# Patient Record
Sex: Female | Born: 1935 | Race: Black or African American | Hispanic: No | Marital: Married | Smoking: Never smoker
Health system: Southern US, Community
[De-identification: ages and names within clinical notes are randomized; demographics above are authoritative.]

## PROBLEM LIST (undated history)

## (undated) DIAGNOSIS — I10 Essential (primary) hypertension: Secondary | ICD-10-CM

## (undated) DIAGNOSIS — IMO0001 Reserved for inherently not codable concepts without codable children: Secondary | ICD-10-CM

---

## 2005-03-21 ENCOUNTER — Encounter: Admission: RE | Admit: 2005-03-21 | Discharge: 2005-03-21 | Payer: Self-pay | Admitting: *Deleted

## 2006-03-23 ENCOUNTER — Encounter: Admission: RE | Admit: 2006-03-23 | Discharge: 2006-03-23 | Payer: Self-pay | Admitting: *Deleted

## 2006-07-22 ENCOUNTER — Emergency Department (HOSPITAL_COMMUNITY): Admission: EM | Admit: 2006-07-22 | Discharge: 2006-07-22 | Payer: Self-pay | Admitting: Emergency Medicine

## 2010-01-15 ENCOUNTER — Emergency Department (HOSPITAL_COMMUNITY): Admission: EM | Admit: 2010-01-15 | Discharge: 2010-01-15 | Payer: Self-pay | Admitting: Emergency Medicine

## 2015-02-24 ENCOUNTER — Inpatient Hospital Stay (HOSPITAL_COMMUNITY)
Admission: EM | Admit: 2015-02-24 | Discharge: 2015-03-02 | DRG: 469 | Disposition: A | Discharge status: Skilled Nursing Facility | Payer: Medicare Other | Attending: Internal Medicine | Admitting: Internal Medicine

## 2015-02-24 ENCOUNTER — Encounter (HOSPITAL_COMMUNITY): Payer: Self-pay | Admitting: *Deleted

## 2015-02-24 ENCOUNTER — Inpatient Hospital Stay (HOSPITAL_COMMUNITY): Payer: Medicare Other

## 2015-02-24 ENCOUNTER — Emergency Department (HOSPITAL_COMMUNITY): Payer: Medicare Other

## 2015-02-24 DIAGNOSIS — D649 Anemia, unspecified: Secondary | ICD-10-CM | POA: Diagnosis not present

## 2015-02-24 DIAGNOSIS — S72011A Unspecified intracapsular fracture of right femur, initial encounter for closed fracture: Secondary | ICD-10-CM | POA: Diagnosis present

## 2015-02-24 DIAGNOSIS — Z79899 Other long term (current) drug therapy: Secondary | ICD-10-CM | POA: Diagnosis not present

## 2015-02-24 DIAGNOSIS — N390 Urinary tract infection, site not specified: Secondary | ICD-10-CM | POA: Diagnosis not present

## 2015-02-24 DIAGNOSIS — I10 Essential (primary) hypertension: Secondary | ICD-10-CM | POA: Diagnosis present

## 2015-02-24 DIAGNOSIS — S72001A Fracture of unspecified part of neck of right femur, initial encounter for closed fracture: Secondary | ICD-10-CM | POA: Diagnosis present

## 2015-02-24 DIAGNOSIS — R739 Hyperglycemia, unspecified: Secondary | ICD-10-CM | POA: Diagnosis present

## 2015-02-24 DIAGNOSIS — J9811 Atelectasis: Secondary | ICD-10-CM | POA: Diagnosis not present

## 2015-02-24 DIAGNOSIS — J9601 Acute respiratory failure with hypoxia: Secondary | ICD-10-CM | POA: Diagnosis not present

## 2015-02-24 DIAGNOSIS — D72829 Elevated white blood cell count, unspecified: Secondary | ICD-10-CM | POA: Diagnosis not present

## 2015-02-24 DIAGNOSIS — Y92129 Unspecified place in nursing home as the place of occurrence of the external cause: Secondary | ICD-10-CM

## 2015-02-24 DIAGNOSIS — W19XXXA Unspecified fall, initial encounter: Secondary | ICD-10-CM

## 2015-02-24 DIAGNOSIS — M79661 Pain in right lower leg: Secondary | ICD-10-CM | POA: Diagnosis present

## 2015-02-24 DIAGNOSIS — W050XXA Fall from non-moving wheelchair, initial encounter: Secondary | ICD-10-CM | POA: Diagnosis present

## 2015-02-24 DIAGNOSIS — S72009A Fracture of unspecified part of neck of unspecified femur, initial encounter for closed fracture: Secondary | ICD-10-CM

## 2015-02-24 DIAGNOSIS — E785 Hyperlipidemia, unspecified: Secondary | ICD-10-CM | POA: Diagnosis present

## 2015-02-24 DIAGNOSIS — F79 Unspecified intellectual disabilities: Secondary | ICD-10-CM | POA: Diagnosis present

## 2015-02-24 DIAGNOSIS — S72001D Fracture of unspecified part of neck of right femur, subsequent encounter for closed fracture with routine healing: Secondary | ICD-10-CM | POA: Diagnosis not present

## 2015-02-24 DIAGNOSIS — D62 Acute posthemorrhagic anemia: Secondary | ICD-10-CM

## 2015-02-24 DIAGNOSIS — Z01810 Encounter for preprocedural cardiovascular examination: Secondary | ICD-10-CM

## 2015-02-24 HISTORY — DX: Reserved for inherently not codable concepts without codable children: IMO0001

## 2015-02-24 HISTORY — DX: Essential (primary) hypertension: I10

## 2015-02-24 LAB — CBC WITH DIFFERENTIAL/PLATELET
Basophils Absolute: 0 10*3/uL (ref 0.0–0.1)
Basophils Relative: 0 % (ref 0–1)
Eosinophils Absolute: 0 10*3/uL (ref 0.0–0.7)
Eosinophils Relative: 0 % (ref 0–5)
HEMATOCRIT: 35.2 % — AB (ref 36.0–46.0)
Hemoglobin: 11.8 g/dL — ABNORMAL LOW (ref 12.0–15.0)
Lymphocytes Relative: 8 % — ABNORMAL LOW (ref 12–46)
Lymphs Abs: 1.2 10*3/uL (ref 0.7–4.0)
MCH: 31.7 pg (ref 26.0–34.0)
MCHC: 33.5 g/dL (ref 30.0–36.0)
MCV: 94.6 fL (ref 78.0–100.0)
MONO ABS: 0.5 10*3/uL (ref 0.1–1.0)
Monocytes Relative: 3 % (ref 3–12)
NEUTROS ABS: 13.2 10*3/uL — AB (ref 1.7–7.7)
Neutrophils Relative %: 89 % — ABNORMAL HIGH (ref 43–77)
Platelets: 152 10*3/uL (ref 150–400)
RBC: 3.72 MIL/uL — ABNORMAL LOW (ref 3.87–5.11)
RDW: 12.2 % (ref 11.5–15.5)
WBC: 14.8 10*3/uL — ABNORMAL HIGH (ref 4.0–10.5)

## 2015-02-24 LAB — COMPREHENSIVE METABOLIC PANEL
ALBUMIN: 4.1 g/dL (ref 3.5–5.0)
ALT: 17 U/L (ref 14–54)
ANION GAP: 11 (ref 5–15)
AST: 33 U/L (ref 15–41)
Alkaline Phosphatase: 55 U/L (ref 38–126)
BUN: 22 mg/dL — ABNORMAL HIGH (ref 6–20)
CO2: 27 mmol/L (ref 22–32)
Calcium: 9.7 mg/dL (ref 8.9–10.3)
Chloride: 100 mmol/L — ABNORMAL LOW (ref 101–111)
Creatinine, Ser: 1.39 mg/dL — ABNORMAL HIGH (ref 0.44–1.00)
GFR calc non Af Amer: 35 mL/min — ABNORMAL LOW (ref >60)
GFR, EST AFRICAN AMERICAN: 41 mL/min — AB (ref >60)
Glucose, Bld: 161 mg/dL — ABNORMAL HIGH (ref 65–99)
Potassium: 4.1 mmol/L (ref 3.5–5.1)
Sodium: 138 mmol/L (ref 135–145)
TOTAL PROTEIN: 8 g/dL (ref 6.5–8.1)
Total Bilirubin: 0.7 mg/dL (ref 0.3–1.2)

## 2015-02-24 LAB — CREATININE, SERUM
Creatinine, Ser: 1.36 mg/dL — ABNORMAL HIGH (ref 0.44–1.00)
GFR, EST AFRICAN AMERICAN: 42 mL/min — AB (ref >60)
GFR, EST NON AFRICAN AMERICAN: 36 mL/min — AB (ref >60)

## 2015-02-24 LAB — CBC
HEMATOCRIT: 34.5 % — AB (ref 36.0–46.0)
Hemoglobin: 11.3 g/dL — ABNORMAL LOW (ref 12.0–15.0)
MCH: 30.8 pg (ref 26.0–34.0)
MCHC: 32.8 g/dL (ref 30.0–36.0)
MCV: 94 fL (ref 78.0–100.0)
PLATELETS: 153 10*3/uL (ref 150–400)
RBC: 3.67 MIL/uL — ABNORMAL LOW (ref 3.87–5.11)
RDW: 12.1 % (ref 11.5–15.5)
WBC: 12.7 10*3/uL — AB (ref 4.0–10.5)

## 2015-02-24 LAB — MAGNESIUM: Magnesium: 1.9 mg/dL (ref 1.7–2.4)

## 2015-02-24 LAB — PHOSPHORUS: Phosphorus: 3.4 mg/dL (ref 2.5–4.6)

## 2015-02-24 MED ORDER — ATENOLOL 50 MG PO TABS
50.0000 mg | ORAL_TABLET | Freq: Two times a day (BID) | ORAL | Status: DC
  Administered 2015-02-24 – 2015-02-27 (×3): 50 mg via ORAL
  Filled 2015-02-24 (×4): qty 1

## 2015-02-24 MED ORDER — VITAMIN D 1000 UNITS PO TABS
3000.0000 [IU] | ORAL_TABLET | Freq: Every day | ORAL | Status: DC
  Administered 2015-02-24 – 2015-03-02 (×6): 3000 [IU] via ORAL
  Filled 2015-02-24 (×6): qty 3

## 2015-02-24 MED ORDER — GABAPENTIN 100 MG PO CAPS
100.0000 mg | ORAL_CAPSULE | Freq: Two times a day (BID) | ORAL | Status: DC
  Administered 2015-02-24 – 2015-03-02 (×11): 100 mg via ORAL
  Filled 2015-02-24 (×11): qty 1

## 2015-02-24 MED ORDER — GUAIFENESIN 100 MG/5ML PO SYRP
200.0000 mg | ORAL_SOLUTION | ORAL | Status: DC | PRN
  Administered 2015-02-26 (×2): 200 mg via ORAL
  Filled 2015-02-24 (×5): qty 10

## 2015-02-24 MED ORDER — ESCITALOPRAM OXALATE 20 MG PO TABS
20.0000 mg | ORAL_TABLET | Freq: Every day | ORAL | Status: DC
  Administered 2015-02-24 – 2015-03-02 (×6): 20 mg via ORAL
  Filled 2015-02-24 (×6): qty 1

## 2015-02-24 MED ORDER — HYDROMORPHONE HCL 1 MG/ML IJ SOLN
0.5000 mg | INTRAMUSCULAR | Status: DC | PRN
  Administered 2015-02-25 – 2015-02-26 (×4): 0.5 mg via INTRAVENOUS
  Filled 2015-02-24 (×4): qty 1

## 2015-02-24 MED ORDER — ENOXAPARIN SODIUM 40 MG/0.4ML ~~LOC~~ SOLN
40.0000 mg | SUBCUTANEOUS | Status: DC
  Administered 2015-02-24 – 2015-02-27 (×3): 40 mg via SUBCUTANEOUS
  Filled 2015-02-24 (×3): qty 0.4

## 2015-02-24 MED ORDER — ONDANSETRON 4 MG PO TBDP
4.0000 mg | ORAL_TABLET | ORAL | Status: AC
  Administered 2015-02-24: 4 mg via ORAL
  Filled 2015-02-24: qty 1

## 2015-02-24 MED ORDER — FENTANYL CITRATE (PF) 100 MCG/2ML IJ SOLN
50.0000 ug | Freq: Once | INTRAMUSCULAR | Status: AC
  Administered 2015-02-24: 50 ug via INTRAVENOUS
  Filled 2015-02-24: qty 2

## 2015-02-24 MED ORDER — DIPHENHYDRAMINE HCL 25 MG PO CAPS: 25.0000 mg | ORAL_CAPSULE | ORAL | Status: DC | PRN

## 2015-02-24 MED ORDER — ONDANSETRON HCL 4 MG/2ML IJ SOLN: 4.0000 mg | Freq: Four times a day (QID) | INTRAMUSCULAR | Status: DC | PRN

## 2015-02-24 MED ORDER — HYDROCODONE-ACETAMINOPHEN 5-325 MG PO TABS
1.0000 | ORAL_TABLET | ORAL | Status: DC | PRN
  Administered 2015-02-24 – 2015-03-01 (×9): 1 via ORAL
  Filled 2015-02-24 (×9): qty 1

## 2015-02-24 MED ORDER — ONDANSETRON HCL 4 MG PO TABS: 4.0000 mg | ORAL_TABLET | Freq: Four times a day (QID) | ORAL | Status: DC | PRN

## 2015-02-24 MED ORDER — LISINOPRIL 10 MG PO TABS
10.0000 mg | ORAL_TABLET | Freq: Every day | ORAL | Status: DC
  Administered 2015-02-25 – 2015-02-27 (×2): 10 mg via ORAL
  Filled 2015-02-24 (×3): qty 1

## 2015-02-24 MED ORDER — ACETAMINOPHEN 325 MG PO TABS: 650.0000 mg | ORAL_TABLET | Freq: Four times a day (QID) | ORAL | Status: DC | PRN

## 2015-02-24 MED ORDER — LAMOTRIGINE 100 MG PO TABS
100.0000 mg | ORAL_TABLET | Freq: Every day | ORAL | Status: DC
  Administered 2015-02-25 – 2015-03-02 (×5): 100 mg via ORAL
  Filled 2015-02-24 (×6): qty 1

## 2015-02-24 MED ORDER — ONDANSETRON 4 MG PO TBDP: 4.0000 mg | ORAL_TABLET | Freq: Once | ORAL | Status: DC

## 2015-02-24 MED ORDER — HYDROCHLOROTHIAZIDE 25 MG PO TABS
25.0000 mg | ORAL_TABLET | Freq: Every day | ORAL | Status: DC
  Administered 2015-02-25 – 2015-02-27 (×2): 25 mg via ORAL
  Filled 2015-02-24 (×3): qty 1

## 2015-02-24 MED ORDER — ADULT MULTIVITAMIN W/MINERALS CH
1.0000 | ORAL_TABLET | Freq: Every day | ORAL | Status: DC
  Administered 2015-02-24 – 2015-03-02 (×5): 1 via ORAL
  Filled 2015-02-24 (×5): qty 1

## 2015-02-24 MED ORDER — SODIUM CHLORIDE 0.9 % IV SOLN
INTRAVENOUS | Status: DC
  Administered 2015-02-24 – 2015-03-02 (×6): via INTRAVENOUS

## 2015-02-24 MED ORDER — POTASSIUM CHLORIDE CRYS ER 20 MEQ PO TBCR
20.0000 meq | EXTENDED_RELEASE_TABLET | Freq: Every day | ORAL | Status: DC
  Administered 2015-02-24 – 2015-03-02 (×6): 20 meq via ORAL
  Filled 2015-02-24 (×6): qty 1

## 2015-02-24 MED ORDER — SIMVASTATIN 20 MG PO TABS
20.0000 mg | ORAL_TABLET | Freq: Every day | ORAL | Status: DC
  Administered 2015-02-24 – 2015-03-02 (×6): 20 mg via ORAL
  Filled 2015-02-24 (×6): qty 1

## 2015-02-24 NOTE — ED Notes (Signed)
Bed: ZO10 Expected date:  Expected time:  Means of arrival:  Comments: EMS- 80yo F, groin pain

## 2015-02-24 NOTE — ED Notes (Signed)
Per [facility], pt transported from [facility], c/o right upper thigh/goin pain & was requesting a pain pill during transport.  She was scheduled for a bath today and has not bathed in several days.  Pt stated she fell but staff denied stating she was sitting in her w/c and just slid to the floor.  Pt presents to ED with no visible bruising but is stating "I hurt real bad".  When questioned where the pain is located, pain indicates right inner thigh.

## 2015-02-24 NOTE — H&P (Signed)
[facility] History and Physical  Mandy Anderson ZOX:[id number] DOB: February 01, 1935 DOA: 02/24/2015  Referring physician: Joycie Peek, PA-C PCP: Vinnie Langton, NP   Chief Complaint: Fall.   HPI: Mandy Anderson is a 80 y.o. female with a past medical history as below who was transferred from the [facility] due to having a fall and complaining of right lower extremity pain after that. He per facility records, patient was sitting in her wheelchair and slid to the floor. The patient is unable to provide further details due to her intellectual disability. She is currently mildly anxious, but is otherwise in no acute distress.   Review of Systems:  Unable to review.  Past Medical History  Diagnosis Date  . Retardation     Per [facility] records is functional   . Hypertension    History reviewed. No pertinent past surgical history. Social History:  reports that she has never smoked. She does not have any smokeless tobacco history on file. She reports that she does not drink alcohol. Her drug history is not on file.  No Known Allergies  Family History  Problem Relation Age of Onset  . Diabetes Mother   . Heart attack Father     Prior to Admission medications   Medication Sig Start Date End Date Taking? Authorizing Provider  acetaminophen (TYLENOL) 325 MG tablet Take 650 mg by mouth every 4 (four) hours as needed for mild pain, fever or headache.   Yes Historical Provider, MD  atenolol (TENORMIN) 50 MG tablet Take 50 mg by mouth 2 (two) times daily. 02/01/15  Yes Historical Provider, MD  cholecalciferol (VITAMIN D) 1000 UNITS tablet Take 3,000 Units by mouth daily.   Yes Historical Provider, MD  diphenhydrAMINE (BENADRYL) 25 mg capsule Take 25 mg by mouth every 4 (four) hours as needed for itching.   Yes Historical Provider, MD  escitalopram (LEXAPRO) 20 MG tablet Take 20 mg by mouth daily. 02/08/15  Yes Historical Provider, MD  gabapentin (NEURONTIN) 100 MG capsule  Take 100 mg by mouth 2 (two) times daily. 02/01/15  Yes Historical Provider, MD  guaifenesin (ROBITUSSIN) 100 MG/5ML syrup Take 200 mg by mouth every 4 (four) hours as needed for cough.   Yes Historical Provider, MD  hydrochlorothiazide (HYDRODIURIL) 25 MG tablet Take 25 mg by mouth daily. 02/08/15  Yes Historical Provider, MD  lamoTRIgine (LAMICTAL) 100 MG tablet Take 100 mg by mouth daily. 02/08/15  Yes Historical Provider, MD  lisinopril (PRINIVIL,ZESTRIL) 10 MG tablet Take 10 mg by mouth daily. 02/16/15  Yes Historical Provider, MD  Multiple Vitamin (MULTIVITAMIN WITH MINERALS) TABS tablet Take 1 tablet by mouth daily.   Yes Historical Provider, MD  potassium chloride SA (K-DUR,KLOR-CON) 20 MEQ tablet Take 20 mEq by mouth daily. 02/05/15  Yes Historical Provider, MD  simvastatin (ZOCOR) 20 MG tablet Take 20 mg by mouth daily. 02/09/15  Yes Historical Provider, MD   Physical Exam: Filed Vitals:   02/24/15 1324 02/24/15 1628 02/24/15 1734 02/24/15 1853  BP: 135/76 182/82 157/78 149/68  Pulse: 71 76 81 73  Temp: 97.6 F (36.4 C)   97.8 F (36.6 C)  TempSrc: Oral   Oral  Resp:  18  16  SpO2: 93% 94% 91% 90%    Wt Readings from Last 3 Encounters:  No data found for Wt    General:  Mildly anxious.  Eyes: PERRL, normal lids, irises & conjunctiva ENT: grossly normal hearing, lips & tongue are dry. Neck: no LAD, masses or  thyromegaly Cardiovascular: RRR, No LE edema. Telemetry: Telemetry was disconnected. Respiratory: CTA bilaterally anterior exam, no w/r/r. Normal respiratory effort. Abdomen: soft, ntnd Skin: no rash or induration seen on limited exam Musculoskeletal: Shortened right lower extremity, pulses are palpable distally and there is a good capillary refill. Psychiatric: Disoriented to time. Neurologic: Follows simple commands. Unable to fully evaluate due to the patient's injury and mental status.           Labs on Admission:  Basic Metabolic Panel:  Recent Labs Lab  02/24/15 1735  NA 138  K 4.1  CL 100*  CO2 27  GLUCOSE 161*  BUN 22*  CREATININE 1.39*  CALCIUM 9.7   Liver Function Tests:  Recent Labs Lab 02/24/15 1735  AST 33  ALT 17  ALKPHOS 55  BILITOT 0.7  PROT 8.0  ALBUMIN 4.1   No results for input(s): LIPASE, AMYLASE in the last 168 hours. No results for input(s): AMMONIA in the last 168 hours. CBC:  Recent Labs Lab 02/24/15 1735  WBC 14.8*  NEUTROABS 13.2*  HGB 11.8*  HCT 35.2*  MCV 94.6  PLT 152    Radiological Exams on Admission: Dg Knee 1-2 Views Right  02/24/2015   CLINICAL DATA:  RIGHT hip and groin pain, slid out of a chair today, has a hip fracture  EXAM: RIGHT KNEE - 1-2 VIEW  COMPARISON:  None  FINDINGS: Osseous demineralization.  Mild medial compartment joint space narrowing.  No acute fracture, dislocation or bone destruction.  No knee joint effusion.  IMPRESSION: Osseous demineralization without acute bony abnormalities.   Electronically Signed   By: Ulyses Southward M.D.   On: 02/24/2015 17:27   Dg Hip Unilat With Pelvis 2-3 Views Right  02/24/2015   CLINICAL DATA:  Patient's lytic from a chair today with an is experiencing persistent right hip and groin pain  EXAM: DG HIP (WITH OR WITHOUT PELVIS) 2-3V RIGHT  COMPARISON:  None in PACs  FINDINGS: The patient has sustained an acute subcapital fracture of the right hip. There is angulation at the fracture site. The femoral head remains positioned in the acetabular cup. The intertrochanteric and subtrochanteric regions appear normal. The observed portions of the right hemipelvis are unremarkable.  IMPRESSION: The patient has sustained an acute subcapital fracture of the right hip.   Electronically Signed   By: David  Swaziland M.D.   On: 02/24/2015 17:27    Echocardiogram: Ordered for tomorrow.   EKG: Independently reviewed. Ordered, but still not done.  Assessment/Plan Principal Problem:   Closed right hip fracture Admit to the [facility] facility for evaluation by  orthopedic surgery and pain management. EKG, chest x-ray and echocardiogram have been ordered.  Active Problems:   HTN (hypertension) Continue current antihypertensive regimen and monitor blood pressure.    Hyperlipidemia Continue simvastatin and monitor LFTs.    Intellectual disability Supportive care.    Hyperglycemia Not sure if the patient is a diabetic or not. Check fasting glucose in the morning. If still elevated, get hemoglobin A1c and start CBG monitoring with regular insulin coverage is appropriate.    Leukocytosis Follow WBC in the morning. No signs yet of active infection, so most likely this could be a stress-related.    Mild anemia Monitor hematocrit and hemoglobin.  Continue other regular meds. Not sure why the patient is some Lexapro, gabapentin and Lamictal.  1. Dr. Eulah Pont from orthopedic surgery was consulted by the emergency department.   Code Status: Full code. DVT Prophylaxis: Lovenox SQ. Family  Communication:  Disposition Plan: Admit for orthopedic surgery evaluation. Further disposition once this has been done.  Time spent: Over 60 minutes.  Bobette Mo [facility] Pager [phone].

## 2015-02-24 NOTE — ED Notes (Signed)
Per Deanna Artis at the facility, the last known time the pt had anything to eat was around 1130 this morning.

## 2015-02-24 NOTE — Consult Note (Signed)
I have reviewed her xrays and tentatively plan is for a Right hip hemiarthroplasty pending discussion with family and patient of Risks/Benefits    Formal c/s to follow   Daran Favaro D

## 2015-02-24 NOTE — ED Notes (Signed)
Called [place] who is listed as pt contact, states she is unable to help Mandy Anderson to please contact [name] youngest sister, Mandy Anderson at [phone]. Spoke with [name] and notified her of the pt situation and that the pt would be transferred to [facility] tonight pending surgery in the morning.

## 2015-02-24 NOTE — ED Provider Notes (Signed)
CSN: [id number]     Arrival date & time 02/24/15  1256 History   First MD Initiated Contact with Patient 02/24/15 1522     Chief Complaint  Patient presents with  . Leg Pain  . Fall   Level V caveat: Dementia  (Consider location/radiation/quality/duration/timing/severity/associated sxs/prior Treatment) HPI Mandy Anderson is a 80 y.o. female with history of mental retardation and some degree of dementia, here from [facility], comes in for evaluation of right leg pain after fall. Patient cannot clarify circumstances around her fall. Per nursing note and [facility], patient was sitting in her wheelchair and slid to the floor. Denies any fall from height. Patient reports diffuse right leg pain that migrates from knee to thigh. Nothing tried to improve symptoms. Certain movements worsen symptoms. No anticoagulation.  Past Medical History  Diagnosis Date  . Retardation     Per [facility] records is functional   . Hypertension    History reviewed. No pertinent past surgical history. Family History  Problem Relation Age of Onset  . Diabetes Mother   . Heart attack Father    Social History  Substance Use Topics  . Smoking status: Never Smoker   . Smokeless tobacco: None  . Alcohol Use: No   OB History    No data available     Review of Systems A 10 point review of systems was completed and was negative except for pertinent positives and negatives as mentioned in the history of present illness     Allergies  Review of patient's allergies indicates no known allergies.  Home Medications   Prior to Admission medications   Medication Sig Start Date End Date Taking? Authorizing Provider  acetaminophen (TYLENOL) 325 MG tablet Take 650 mg by mouth every 4 (four) hours as needed for mild pain, fever or headache.   Yes Historical Provider, MD  atenolol (TENORMIN) 50 MG tablet Take 50 mg by mouth 2 (two) times daily. 02/01/15  Yes Historical Provider, MD  cholecalciferol (VITAMIN  D) 1000 UNITS tablet Take 3,000 Units by mouth daily.   Yes Historical Provider, MD  diphenhydrAMINE (BENADRYL) 25 mg capsule Take 25 mg by mouth every 4 (four) hours as needed for itching.   Yes Historical Provider, MD  escitalopram (LEXAPRO) 20 MG tablet Take 20 mg by mouth daily. 02/08/15  Yes Historical Provider, MD  gabapentin (NEURONTIN) 100 MG capsule Take 100 mg by mouth 2 (two) times daily. 02/01/15  Yes Historical Provider, MD  guaifenesin (ROBITUSSIN) 100 MG/5ML syrup Take 200 mg by mouth every 4 (four) hours as needed for cough.   Yes Historical Provider, MD  hydrochlorothiazide (HYDRODIURIL) 25 MG tablet Take 25 mg by mouth daily. 02/08/15  Yes Historical Provider, MD  lamoTRIgine (LAMICTAL) 100 MG tablet Take 100 mg by mouth daily. 02/08/15  Yes Historical Provider, MD  lisinopril (PRINIVIL,ZESTRIL) 10 MG tablet Take 10 mg by mouth daily. 02/16/15  Yes Historical Provider, MD  Multiple Vitamin (MULTIVITAMIN WITH MINERALS) TABS tablet Take 1 tablet by mouth daily.   Yes Historical Provider, MD  potassium chloride SA (K-DUR,KLOR-CON) 20 MEQ tablet Take 20 mEq by mouth daily. 02/05/15  Yes Historical Provider, MD  simvastatin (ZOCOR) 20 MG tablet Take 20 mg by mouth daily. 02/09/15  Yes Historical Provider, MD   BP 158/73 mmHg  Pulse 74  Temp(Src) 97.8 F (36.6 C) (Oral)  Resp 16  SpO2 91% Physical Exam  Constitutional: She is oriented to person, place, and time. She appears well-developed and well-nourished.  HENT:  Head: Normocephalic and atraumatic.  Mouth/Throat: Oropharynx is clear and moist.  Eyes: Conjunctivae are normal. Pupils are equal, round, and reactive to light. Right eye exhibits no discharge. Left eye exhibits no discharge. No scleral icterus.  Neck: Neck supple.  Cardiovascular: Normal rate, regular rhythm and normal heart sounds.   Pulmonary/Chest: Effort normal and breath sounds normal. No respiratory distress. She has no wheezes. She has no rales.  Abdominal: Soft.  There is no tenderness.  Musculoskeletal: She exhibits no tenderness.  No obvious deformities to right side or right lower extremity. No lesions, ecchymosis. Limited range of motion secondary to pain  Neurological: She is alert and oriented to person, place, and time.  Patient does not fully participate in exam likely due to underlying dementia. Cranial Nerves II-XII grossly intact, motor and sensation appear to be intact.  Skin: Skin is warm and dry. No rash noted.  Psychiatric: She has a normal mood and affect.  Patient is currently acting at baseline per nursing facility.  Nursing note and vitals reviewed.   ED Course  Procedures (including critical care time) Labs Review Labs Reviewed  CBC WITH DIFFERENTIAL/PLATELET - Abnormal; Notable for the following:    WBC 14.8 (*)    RBC 3.72 (*)    Hemoglobin 11.8 (*)    HCT 35.2 (*)    Neutrophils Relative % 89 (*)    Neutro Abs 13.2 (*)    Lymphocytes Relative 8 (*)    All other components within normal limits  COMPREHENSIVE METABOLIC PANEL - Abnormal; Notable for the following:    Chloride 100 (*)    Glucose, Bld 161 (*)    BUN 22 (*)    Creatinine, Ser 1.39 (*)    GFR calc non Af Amer 35 (*)    GFR calc Af Amer 41 (*)    All other components within normal limits  MAGNESIUM  PHOSPHORUS    Imaging Review Dg Knee 1-2 Views Right  02/24/2015   CLINICAL DATA:  RIGHT hip and groin pain, slid out of a chair today, has a hip fracture  EXAM: RIGHT KNEE - 1-2 VIEW  COMPARISON:  None  FINDINGS: Osseous demineralization.  Mild medial compartment joint space narrowing.  No acute fracture, dislocation or bone destruction.  No knee joint effusion.  IMPRESSION: Osseous demineralization without acute bony abnormalities.   Electronically Signed   By: Ulyses Southward M.D.   On: 02/24/2015 17:27   Dg Hip Unilat With Pelvis 2-3 Views Right  02/24/2015   CLINICAL DATA:  Patient's lytic from a chair today with an is experiencing persistent right hip and  groin pain  EXAM: DG HIP (WITH OR WITHOUT PELVIS) 2-3V RIGHT  COMPARISON:  None in PACs  FINDINGS: The patient has sustained an acute subcapital fracture of the right hip. There is angulation at the fracture site. The femoral head remains positioned in the acetabular cup. The intertrochanteric and subtrochanteric regions appear normal. The observed portions of the right hemipelvis are unremarkable.  IMPRESSION: The patient has sustained an acute subcapital fracture of the right hip.   Electronically Signed   By: David  Swaziland M.D.   On: 02/24/2015 17:27   I have personally reviewed and evaluated these images and lab results as part of my medical decision-making.   EKG Interpretation None     Meds given in ED:  Medications  ondansetron (ZOFRAN-ODT) disintegrating tablet 4 mg (4 mg Oral Given 02/24/15 1825)  fentaNYL (SUBLIMAZE) injection 50 mcg (50 mcg Intravenous  Given 02/24/15 1825)  fentaNYL (SUBLIMAZE) injection 50 mcg (50 mcg Intravenous Given 02/24/15 2030)    New Prescriptions   No medications on file   Filed Vitals:   02/24/15 1628 02/24/15 1734 02/24/15 1853 02/24/15 2008  BP: 182/82 157/78 149/68 158/73  Pulse: 76 81 73 74  Temp:   97.8 F (36.6 C)   TempSrc:   Oral   Resp: SpO2: 94% 91% 90% 91%     MDM  80 year old female here from [facility] facility complaining of right leg pain after a fall from wheelchair. No gross deformity or lesions on exam. X-ray shows acute subcapital fracture of right hip. Patient does not take any anticoagulation. Last ate at 11:30 AM. Will consult orthopedic surgery. Also discussed with my attending, Dr. Dalene Seltzer who also saw and evaluated the patient. Spoke with orthopedic surgery, Dr. Eulah Pont. Patient will require surgical arthroplasty, requests medical admission and transfer to [facility]. Patient's family was contacted and they are aware of patient's situation Final diagnoses:  Closed right hip fracture, initial encounter         Joycie Peek, PA-C 02/24/15 2053  Alvira Monday, MD 02/25/15 [phone]

## 2015-02-24 NOTE — ED Notes (Signed)
[facility] vital signs:  BS 143, b/p 180/90, p 80 & regular.

## 2015-02-24 NOTE — ED Notes (Signed)
While pt was in xray, tech called to say pt was vomiting. Order for zofran odt administered, PA made aware of the same. Pt brought back to room at this time

## 2015-02-25 ENCOUNTER — Encounter (HOSPITAL_COMMUNITY)
Admission: EM | Disposition: A | Discharge status: Skilled Nursing Facility | Payer: Self-pay | Source: Home / Self Care | Attending: Internal Medicine

## 2015-02-25 DIAGNOSIS — E785 Hyperlipidemia, unspecified: Secondary | ICD-10-CM

## 2015-02-25 DIAGNOSIS — I10 Essential (primary) hypertension: Secondary | ICD-10-CM

## 2015-02-25 LAB — COMPREHENSIVE METABOLIC PANEL
ALT: 17 U/L (ref 14–54)
AST: 31 U/L (ref 15–41)
Albumin: 3.5 g/dL (ref 3.5–5.0)
Alkaline Phosphatase: 53 U/L (ref 38–126)
Anion gap: 10 (ref 5–15)
BUN: 21 mg/dL — ABNORMAL HIGH (ref 6–20)
CHLORIDE: 99 mmol/L — AB (ref 101–111)
CO2: 30 mmol/L (ref 22–32)
CREATININE: 1.39 mg/dL — AB (ref 0.44–1.00)
Calcium: 9.4 mg/dL (ref 8.9–10.3)
GFR, EST AFRICAN AMERICAN: 41 mL/min — AB (ref >60)
GFR, EST NON AFRICAN AMERICAN: 35 mL/min — AB (ref >60)
Glucose, Bld: 131 mg/dL — ABNORMAL HIGH (ref 65–99)
Potassium: 3.8 mmol/L (ref 3.5–5.1)
Sodium: 139 mmol/L (ref 135–145)
Total Bilirubin: 0.7 mg/dL (ref 0.3–1.2)
Total Protein: 7.5 g/dL (ref 6.5–8.1)

## 2015-02-25 LAB — SURGICAL PCR SCREEN
MRSA, PCR: NEGATIVE
STAPHYLOCOCCUS AUREUS: NEGATIVE

## 2015-02-25 SURGERY — HEMIARTHROPLASTY, HIP, DIRECT ANTERIOR APPROACH, FOR FRACTURE
Anesthesia: General | Laterality: Right

## 2015-02-25 MED ORDER — CHLORHEXIDINE GLUCONATE 4 % EX LIQD
60.0000 mL | Freq: Once | CUTANEOUS | Status: AC
  Administered 2015-02-25: 4 via TOPICAL
  Filled 2015-02-25: qty 60

## 2015-02-25 MED ORDER — CHLORHEXIDINE GLUCONATE 0.12 % MT SOLN
15.0000 mL | Freq: Two times a day (BID) | OROMUCOSAL | Status: DC
  Administered 2015-02-25: 15 mL via OROMUCOSAL
  Filled 2015-02-25: qty 15

## 2015-02-25 MED ORDER — CEFAZOLIN SODIUM-DEXTROSE 2-3 GM-% IV SOLR
2.0000 g | INTRAVENOUS | Status: DC
  Filled 2015-02-25: qty 50

## 2015-02-25 MED ORDER — CETYLPYRIDINIUM CHLORIDE 0.05 % MT LIQD
7.0000 mL | Freq: Two times a day (BID) | OROMUCOSAL | Status: DC
  Administered 2015-02-25: 7 mL via OROMUCOSAL

## 2015-02-25 MED ORDER — ACETAMINOPHEN 500 MG PO TABS
1000.0000 mg | ORAL_TABLET | Freq: Once | ORAL | Status: AC
  Administered 2015-02-25: 1000 mg via ORAL

## 2015-02-25 NOTE — Clinical Social Work Note (Addendum)
CSW received consult for patient who is from [facility].  FL2 has been completed and signed by physician and is on the chart.  CSW attempted to call [facility] to discuss patient, however there was no answer, will continue to follow patient's progress.  2:51 PM CSW contacted [facility] who said patient does not have Medicare, they only have Medicaid in the system for her insurance.  [facility] stated that patient is usually talkative, CSW tried to contact patient's sister [name] and she was not available, message left on machine.  [facility] stated that patient has been living at ALF for 10 or more years and the family is quite supportive for her.  Full assessment to follow.  4:30 PM CSW contacted patient's DSS Medicaid worker Austin Miles [phone] who confirmed patient does have Medicare, patient's medicare number is [id number].  CSW will notify financial counselor with information to be confirmed and updated in patient's file.     Ervin Knack. Eddith Mentor, MSW, Theresia Majors [phone] 02/25/2015 5:26 PM

## 2015-02-25 NOTE — Progress Notes (Signed)
Orthopedic Tech Progress Note Patient Details:  Mandy Anderson [birth year] [id number] Patient unable to use [facility] Patient ID: [name], female   DOB: 10-Sep-1934, 80 y.o.   MRN: [id number]   [name] 02/25/2015, 6:58 AM

## 2015-02-25 NOTE — Progress Notes (Signed)
[facility] PROGRESS NOTE  Mandy Anderson ZOX:[id number] DOB: 10/04/1934 DOA: 02/24/2015 PCP: Vinnie Langton, NP  Brief Summary  Mandy Anderson is a 80 y.o. female with history of mental retardation and hypertension who presented from [facility] after she slid to the floor from her wheelchair causing a subcapital fracture of the R hip.  Assessment/Plan  Closed right hip fracture -  Appreciate orthopedics assistance -  CXR clear and on RA, no hx of asthma or COPD -  Chest pain free  -  Still waiting on EKG given hx of HTN/HLD, but probably low risk for surgery  HTN (hypertension), BP elevated likely due to pain -  Continue atenolol, HCTZ, lisinopril -  Control pain  Hyperlipidemia, stable, continue simvastatin  Intellectual disability, having difficulty reaching NOK/guardian.  Patient does not have capacity to make medical decisions - Lexapro, gabapentin and Lamictal  Hyperglycemia,  -  Check hemoglobin A1c  Leukocytosis, likely stress related and trending down -  CXR neg -  Check UA  Normocytic anemia, mild, hgb stable.  Defer to PCP  Diet:  NPO Access:  PIV IVF:  yes Proph:  lovenox  Code Status: full Family Communication: patient alone Disposition Plan: pending surgery, still waiting to reach family to get consent for procedure   Consultants:  Orthopedic surgery, Delbert Harness  Procedures:  CXR  XR hip   XR knee right  Antibiotics:  none   HPI/Subjective:  Having pain in the medial right thigh and pain on the lateral dorsum of the right foot.  Denies CP, SOB, nausea, vomiting, diarrhea  Objective: Filed Vitals:   02/24/15 2008 02/24/15 2224 02/25/15 0654 02/25/15 1000  BP: 158/73 155/70 150/64   Pulse: 74 70 61   Temp:  97.8 F (36.6 C) 98.2 F (36.8 C)   TempSrc:  Oral Oral   Resp: Height:     (1.676 m)  Weight:    70.7 kg (155 lb 13.8 oz)  SpO2: 91% 93% 100%     Intake/Output Summary (Last 24 hours) at  02/25/15 1118 Last data filed at 02/25/15 0820  Gross per 24 hour  Intake 384.17 ml  Output      0 ml  Net 384.17 ml   Filed Weights   02/25/15 1000  Weight: 70.7 kg (155 lb 13.8 oz)   Body mass index is 25.17 kg/(m^2).  Exam:   General:  Adult female, No acute distress  HEENT:  NCAT, MMM  Cardiovascular:  RRR, nl S1, S2 no mrg, 2+ pulses, warm extremities  Respiratory:  CTAB, no increased WOB  Abdomen:   NABS, soft, ND, mild TTP in the suprapubic area without rebound or guarding  MSK:   Normal tone and bulk, mild external rotation of the right leg with pain with hip flexion and internal and external rotation  Neuro:  Grossly intact  Data Reviewed: Basic Metabolic Panel:  Recent Labs Lab 02/24/15 1735 02/24/15 2149 02/25/15 0340  NA 138  --  139  K 4.1  --  3.8  CL 100*  --  99*  CO2 27  --  30  GLUCOSE 161*  --  131*  BUN 22*  --  21*  CREATININE 1.39* 1.36* 1.39*  CALCIUM 9.7  --  9.4  MG 1.9  --   --   PHOS 3.4  --   --    Liver Function Tests:  Recent Labs Lab 02/24/15 1735 02/25/15 0340  AST 33 31  ALT 17 17  ALKPHOS 55 53  BILITOT 0.7 0.7  PROT 8.0 7.5  ALBUMIN 4.1 3.5   No results for input(s): LIPASE, AMYLASE in the last 168 hours. No results for input(s): AMMONIA in the last 168 hours. CBC:  Recent Labs Lab 02/24/15 1735 02/24/15 2149  WBC 14.8* 12.7*  NEUTROABS 13.2*  --   HGB 11.8* 11.3*  HCT 35.2* 34.5*  MCV 94.6 94.0  PLT 152 153    Recent Results (from the past 240 hour(s))  Surgical pcr screen     Status: None   Collection Time: 02/25/15  4:40 AM  Result Value Ref Range Status   MRSA, PCR NEGATIVE NEGATIVE Final   Staphylococcus aureus NEGATIVE NEGATIVE Final    Comment:        The Xpert SA Assay (FDA approved for NASAL specimens in patients over 49 years of age), is one component of a comprehensive surveillance program.  Test performance has been validated by [facility] for patients greater than or equal to  34 year old. It is not intended to diagnose infection nor to guide or monitor treatment.      Studies: Dg Knee 1-2 Views Right  02/24/2015   CLINICAL DATA:  RIGHT hip and groin pain, slid out of a chair today, has a hip fracture  EXAM: RIGHT KNEE - 1-2 VIEW  COMPARISON:  None  FINDINGS: Osseous demineralization.  Mild medial compartment joint space narrowing.  No acute fracture, dislocation or bone destruction.  No knee joint effusion.  IMPRESSION: Osseous demineralization without acute bony abnormalities.   Electronically Signed   By: Ulyses Southward M.D.   On: 02/24/2015 17:27   Chest Portable 1 View  02/24/2015   CLINICAL DATA:  80 year old female with hip fracture. Preop chest x-ray  EXAM: PORTABLE CHEST - 1 VIEW  COMPARISON:  None.  FINDINGS: The heart size and mediastinal contours are within normal limits. Both lungs are clear. The visualized skeletal structures are unremarkable.  IMPRESSION: No active disease.   Electronically Signed   By: Elgie Collard M.D.   On: 02/24/2015 23:44   Dg Hip Unilat With Pelvis 2-3 Views Right  02/24/2015   CLINICAL DATA:  Patient's lytic from a chair today with an is experiencing persistent right hip and groin pain  EXAM: DG HIP (WITH OR WITHOUT PELVIS) 2-3V RIGHT  COMPARISON:  None in PACs  FINDINGS: The patient has sustained an acute subcapital fracture of the right hip. There is angulation at the fracture site. The femoral head remains positioned in the acetabular cup. The intertrochanteric and subtrochanteric regions appear normal. The observed portions of the right hemipelvis are unremarkable.  IMPRESSION: The patient has sustained an acute subcapital fracture of the right hip.   Electronically Signed   By: David  Swaziland M.D.   On: 02/24/2015 17:27    Scheduled Meds: . antiseptic oral rinse  7 mL Mouth Rinse q12n4p  . atenolol  50 mg Oral BID  . chlorhexidine  15 mL Mouth Rinse BID  . cholecalciferol  3,000 Units Oral Daily  . enoxaparin (LOVENOX)  injection  40 mg Subcutaneous Q24H  . escitalopram  20 mg Oral Daily  . gabapentin  100 mg Oral BID  . hydrochlorothiazide  25 mg Oral Daily  . lamoTRIgine  100 mg Oral Daily  . lisinopril  10 mg Oral Daily  . multivitamin with minerals  1 tablet Oral Daily  . potassium chloride SA  20 mEq Oral Daily  . simvastatin  20 mg Oral Daily   Continuous Infusions: . sodium chloride 50 mL/hr at 02/24/15 2220    Principal Problem:   Closed right hip fracture Active Problems:   HTN (hypertension)   Hyperlipidemia   Intellectual disability   Hyperglycemia   Leukocytosis   Mild anemia    Time spent: 30 min    Jun Rightmyer, [facility]  [facility] Pager [phone]. If 7PM-7AM, please contact night-coverage at [email or web address], password [facility] 02/25/2015, 11:18 AM  LOS: 1 day

## 2015-02-25 NOTE — Progress Notes (Signed)
Called Brittney PA-C to let her know that the family (little sister) gave consent for the patient to have her hip surgery.

## 2015-02-25 NOTE — Progress Notes (Signed)
Per ortho recommendation called and spoke with patient's older sister Raford Pitcher who said that younger sister, Georganna Skeans, is in charge of making healthcare decisions for patient. Called [facility] and left a message regarding patient's need for surgery. Awaiting call back.

## 2015-02-25 NOTE — Progress Notes (Signed)
Nutrition Consult -- Hip Fracture Protocol   80 y.o. female who complains of R hip pain after a fall at [facility] She was transferred to the ED and found to have a subcapital fracture of the R hip.  Patient reports good appetite and good intake PTA. She c/o a "frog in my throat." Medical team is trying to get in contact with family to determine route of care. Patient is currently NPO for potential hip surgery. No skin or GI issues noted. BMI = 25 (WNL). No education needs identified at this time.  No nutrition diagnosis at this time. No nutrition needs identified. Please re-consult RD if nutrition concerns arise.  Joaquin Courts, RD, LDN, CNSC Pager [phone] After Hours Pager [phone]

## 2015-02-25 NOTE — Consult Note (Signed)
ORTHOPAEDIC CONSULTATION  REQUESTING PHYSICIAN: Janece Canterbury, MD  Chief Complaint: R hip pain  HPI: Mandy Anderson is a 80 y.o. female who complains of R hip pain after a fall at the [facility]).  Per reports, the patient slid from a wheelchair to the floor.  She began complaining of R hip pain thereafter.  She was transferred to the ED and found to have a subcapital fracture of the R hip.  She was place on bedrest and NPO.  At baseline, the patient is limited to nothing more than stand to pivot/transfer mobility wise.    Past Medical History  Diagnosis Date  . Retardation     Per [facility] records is functional   . Hypertension    History reviewed. No pertinent past surgical history. Social History   Social History  . Marital Status: Married    Spouse Name: N/A  . Number of Children: N/A  . Years of Education: N/A   Social History Main Topics  . Smoking status: Never Smoker   . Smokeless tobacco: None  . Alcohol Use: No  . Drug Use: None  . Sexual Activity: Not Asked   Other Topics Concern  . None   Social History Narrative  . None   Family History  Problem Relation Age of Onset  . Diabetes Mother   . Heart attack Father    No Known Allergies Prior to Admission medications   Medication Sig Start Date End Date Taking? Authorizing Provider  acetaminophen (TYLENOL) 325 MG tablet Take 650 mg by mouth every 4 (four) hours as needed for mild pain, fever or headache.   Yes Historical Provider, MD  atenolol (TENORMIN) 50 MG tablet Take 50 mg by mouth 2 (two) times daily. 02/01/15  Yes Historical Provider, MD  cholecalciferol (VITAMIN D) 1000 UNITS tablet Take 3,000 Units by mouth daily.   Yes Historical Provider, MD  diphenhydrAMINE (BENADRYL) 25 mg capsule Take 25 mg by mouth every 4 (four) hours as needed for itching.   Yes Historical Provider, MD  escitalopram (LEXAPRO) 20 MG tablet Take 20 mg by mouth daily. 02/08/15  Yes Historical Provider, MD    gabapentin (NEURONTIN) 100 MG capsule Take 100 mg by mouth 2 (two) times daily. 02/01/15  Yes Historical Provider, MD  guaifenesin (ROBITUSSIN) 100 MG/5ML syrup Take 200 mg by mouth every 4 (four) hours as needed for cough.   Yes Historical Provider, MD  hydrochlorothiazide (HYDRODIURIL) 25 MG tablet Take 25 mg by mouth daily. 02/08/15  Yes Historical Provider, MD  lamoTRIgine (LAMICTAL) 100 MG tablet Take 100 mg by mouth daily. 02/08/15  Yes Historical Provider, MD  lisinopril (PRINIVIL,ZESTRIL) 10 MG tablet Take 10 mg by mouth daily. 02/16/15  Yes Historical Provider, MD  Multiple Vitamin (MULTIVITAMIN WITH MINERALS) TABS tablet Take 1 tablet by mouth daily.   Yes Historical Provider, MD  potassium chloride SA (K-DUR,KLOR-CON) 20 MEQ tablet Take 20 mEq by mouth daily. 02/05/15  Yes Historical Provider, MD  simvastatin (ZOCOR) 20 MG tablet Take 20 mg by mouth daily. 02/09/15  Yes Historical Provider, MD   Dg Knee 1-2 Views Right  02/24/2015   CLINICAL DATA:  RIGHT hip and groin pain, slid out of a chair today, has a hip fracture  EXAM: RIGHT KNEE - 1-2 VIEW  COMPARISON:  None  FINDINGS: Osseous demineralization.  Mild medial compartment joint space narrowing.  No acute fracture, dislocation or bone destruction.  No knee joint effusion.  IMPRESSION: Osseous demineralization without  acute bony abnormalities.   Electronically Signed   By: Lavonia Dana M.D.   On: 02/24/2015 17:27   Chest Portable 1 View  02/24/2015   CLINICAL DATA:  80 year old female with hip fracture. Preop chest x-ray  EXAM: PORTABLE CHEST - 1 VIEW  COMPARISON:  None.  FINDINGS: The heart size and mediastinal contours are within normal limits. Both lungs are clear. The visualized skeletal structures are unremarkable.  IMPRESSION: No active disease.   Electronically Signed   By: Anner Crete M.D.   On: 02/24/2015 23:44   Dg Hip Unilat With Pelvis 2-3 Views Right  02/24/2015   CLINICAL DATA:  Patient's lytic from a chair today with an is  experiencing persistent right hip and groin pain  EXAM: DG HIP (WITH OR WITHOUT PELVIS) 2-3V RIGHT  COMPARISON:  None in PACs  FINDINGS: The patient has sustained an acute subcapital fracture of the right hip. There is angulation at the fracture site. The femoral head remains positioned in the acetabular cup. The intertrochanteric and subtrochanteric regions appear normal. The observed portions of the right hemipelvis are unremarkable.  IMPRESSION: The patient has sustained an acute subcapital fracture of the right hip.   Electronically Signed   By: David  Martinique M.D.   On: 02/24/2015 17:27    Positive ROS: All other systems have been reviewed and were otherwise negative with the exception of those mentioned in the HPI and as above.  Labs cbc  Recent Labs  02/24/15 1735 02/24/15 2149  WBC 14.8* 12.7*  HGB 11.8* 11.3*  HCT 35.2* 34.5*  PLT 152 153    Labs inflam No results for input(s): CRP in the last 72 hours.  Invalid input(s): ESR  Labs coag No results for input(s): INR, PTT in the last 72 hours.  Invalid input(s): PT   Recent Labs  02/24/15 1735 02/24/15 2149 02/25/15 0340  NA 138  --  139  K 4.1  --  3.8  CL 100*  --  99*  CO2 27  --  30  GLUCOSE 161*  --  131*  BUN 22*  --  21*  CREATININE 1.39* 1.36* 1.39*  CALCIUM 9.7  --  9.4    Physical Exam: Filed Vitals:   02/25/15 0654  BP: 150/64  Pulse: 61  Temp: 98.2 F (36.8 C)  Resp: 20   General: Confused due to mental retardation Cardiovascular: No pedal edema Respiratory: No cyanosis, no use of accessory musculature GI: No organomegaly, abdomen is soft and non-tender Skin: No lesions in the area of chief complaint other than those listed below in MSK exam.  Neurologic: Sensation intact distally Psychiatric: confused due to mental retardation  Lymphatic: No axillary or cervical lymphadenopathy  MUSCULOSKELETAL:  R hip is tender to palpation.  Limited ROM due to pain.  Sensation intact with 2+ distal  pulses. Other extremities are atraumatic with painless ROM and NVI.  Assessment: R subcapital fracture of the femur  Plan: Tentative plan to take to OR for hemiarthroplasty of R hip once cleared by medicine team.  Will need to touch base with family or medical decision maker to see if they are agreeable with surgical intervention.  Will remain NPO till final decision about surgery.   Weight Bearing Status: bedrest   [facility] [phone]   02/25/2015 9:42 AM

## 2015-02-26 ENCOUNTER — Inpatient Hospital Stay (HOSPITAL_COMMUNITY): Payer: Medicare Other | Admitting: Anesthesiology

## 2015-02-26 ENCOUNTER — Inpatient Hospital Stay (HOSPITAL_COMMUNITY): Payer: Medicare Other

## 2015-02-26 ENCOUNTER — Encounter (HOSPITAL_COMMUNITY): Payer: Self-pay | Admitting: Anesthesiology

## 2015-02-26 ENCOUNTER — Encounter (HOSPITAL_COMMUNITY)
Admission: EM | Disposition: A | Discharge status: Skilled Nursing Facility | Payer: Self-pay | Source: Home / Self Care | Attending: Internal Medicine

## 2015-02-26 DIAGNOSIS — R739 Hyperglycemia, unspecified: Secondary | ICD-10-CM

## 2015-02-26 DIAGNOSIS — D649 Anemia, unspecified: Secondary | ICD-10-CM

## 2015-02-26 HISTORY — PX: HIP ARTHROPLASTY: SHX981

## 2015-02-26 LAB — COMPREHENSIVE METABOLIC PANEL
ALK PHOS: 41 U/L (ref 38–126)
ALT: 14 U/L (ref 14–54)
ANION GAP: 6 (ref 5–15)
AST: 21 U/L (ref 15–41)
Albumin: 3 g/dL — ABNORMAL LOW (ref 3.5–5.0)
BUN: 26 mg/dL — ABNORMAL HIGH (ref 6–20)
CALCIUM: 8.7 mg/dL — AB (ref 8.9–10.3)
CO2: 28 mmol/L (ref 22–32)
Chloride: 105 mmol/L (ref 101–111)
Creatinine, Ser: 1.5 mg/dL — ABNORMAL HIGH (ref 0.44–1.00)
GFR calc non Af Amer: 32 mL/min — ABNORMAL LOW (ref >60)
GFR, EST AFRICAN AMERICAN: 37 mL/min — AB (ref >60)
Glucose, Bld: 93 mg/dL (ref 65–99)
POTASSIUM: 4.2 mmol/L (ref 3.5–5.1)
SODIUM: 139 mmol/L (ref 135–145)
TOTAL PROTEIN: 6.4 g/dL — AB (ref 6.5–8.1)
Total Bilirubin: 0.6 mg/dL (ref 0.3–1.2)

## 2015-02-26 LAB — URINALYSIS, ROUTINE W REFLEX MICROSCOPIC
BILIRUBIN URINE: NEGATIVE
GLUCOSE, UA: NEGATIVE mg/dL
Ketones, ur: NEGATIVE mg/dL
Nitrite: NEGATIVE
PH: 5 (ref 5.0–8.0)
Protein, ur: NEGATIVE mg/dL
SPECIFIC GRAVITY, URINE: 1.015 (ref 1.005–1.030)
Urobilinogen, UA: 0.2 mg/dL (ref 0.0–1.0)

## 2015-02-26 LAB — URINE MICROSCOPIC-ADD ON

## 2015-02-26 SURGERY — HEMIARTHROPLASTY, HIP, DIRECT ANTERIOR APPROACH, FOR FRACTURE
Anesthesia: Spinal | Site: Hip | Laterality: Right

## 2015-02-26 MED ORDER — ACETAMINOPHEN 650 MG RE SUPP: 650.0000 mg | Freq: Four times a day (QID) | RECTAL | Status: DC | PRN

## 2015-02-26 MED ORDER — PROPOFOL INFUSION 10 MG/ML OPTIME
INTRAVENOUS | Status: DC | PRN
  Administered 2015-02-26: 25 ug/kg/min via INTRAVENOUS

## 2015-02-26 MED ORDER — FENTANYL CITRATE (PF) 100 MCG/2ML IJ SOLN
INTRAMUSCULAR | Status: DC | PRN
  Administered 2015-02-26: 50 ug via INTRAVENOUS

## 2015-02-26 MED ORDER — PROPOFOL 10 MG/ML IV BOLUS
INTRAVENOUS | Status: AC
  Filled 2015-02-26: qty 20

## 2015-02-26 MED ORDER — CEFAZOLIN SODIUM-DEXTROSE 2-3 GM-% IV SOLR
2.0000 g | Freq: Four times a day (QID) | INTRAVENOUS | Status: AC
  Administered 2015-02-26 (×2): 2 g via INTRAVENOUS
  Filled 2015-02-26 (×2): qty 50

## 2015-02-26 MED ORDER — FENTANYL CITRATE (PF) 250 MCG/5ML IJ SOLN
INTRAMUSCULAR | Status: AC
  Filled 2015-02-26: qty 5

## 2015-02-26 MED ORDER — MORPHINE SULFATE (PF) 2 MG/ML IV SOLN: 0.5000 mg | INTRAVENOUS | Status: DC | PRN

## 2015-02-26 MED ORDER — METOCLOPRAMIDE HCL 5 MG PO TABS: 5.0000 mg | ORAL_TABLET | Freq: Three times a day (TID) | ORAL | Status: DC | PRN

## 2015-02-26 MED ORDER — BUPIVACAINE IN DEXTROSE 0.75-8.25 % IT SOLN
INTRATHECAL | Status: DC | PRN
  Administered 2015-02-26: 1.8 mL via INTRATHECAL

## 2015-02-26 MED ORDER — 0.9 % SODIUM CHLORIDE (POUR BTL) OPTIME
TOPICAL | Status: DC | PRN
  Administered 2015-02-26: 1000 mL

## 2015-02-26 MED ORDER — LACTATED RINGERS IV SOLN
INTRAVENOUS | Status: DC | PRN
  Administered 2015-02-26: 07:00:00 via INTRAVENOUS

## 2015-02-26 MED ORDER — CEFAZOLIN SODIUM-DEXTROSE 2-3 GM-% IV SOLR
INTRAVENOUS | Status: DC | PRN
  Administered 2015-02-26: 2 g via INTRAVENOUS

## 2015-02-26 MED ORDER — PHENOL 1.4 % MT LIQD: 1.0000 | OROMUCOSAL | Status: DC | PRN

## 2015-02-26 MED ORDER — HYDROCODONE-ACETAMINOPHEN 5-325 MG PO TABS: 1.0000 | ORAL_TABLET | Freq: Four times a day (QID) | ORAL | Status: AC | PRN

## 2015-02-26 MED ORDER — METOCLOPRAMIDE HCL 5 MG/ML IJ SOLN: 5.0000 mg | Freq: Three times a day (TID) | INTRAMUSCULAR | Status: DC | PRN

## 2015-02-26 MED ORDER — MIDAZOLAM HCL 2 MG/2ML IJ SOLN
INTRAMUSCULAR | Status: AC
  Filled 2015-02-26: qty 4

## 2015-02-26 MED ORDER — PROPOFOL 10 MG/ML IV BOLUS
INTRAVENOUS | Status: DC | PRN
  Administered 2015-02-26: 20 mg via INTRAVENOUS
  Administered 2015-02-26 (×2): 10 mg via INTRAVENOUS

## 2015-02-26 MED ORDER — ASPIRIN EC 325 MG PO TBEC
325.0000 mg | DELAYED_RELEASE_TABLET | Freq: Every day | ORAL | Status: DC
  Administered 2015-02-27 – 2015-03-02 (×4): 325 mg via ORAL
  Filled 2015-02-26 (×4): qty 1

## 2015-02-26 MED ORDER — ACETAMINOPHEN 325 MG PO TABS: 650.0000 mg | ORAL_TABLET | Freq: Four times a day (QID) | ORAL | Status: DC | PRN

## 2015-02-26 MED ORDER — MENTHOL 3 MG MT LOZG: 1.0000 | LOZENGE | OROMUCOSAL | Status: DC | PRN

## 2015-02-26 MED ORDER — ASPIRIN 325 MG PO TABS: 325.0000 mg | ORAL_TABLET | Freq: Every day | ORAL | Status: DC

## 2015-02-26 SURGICAL SUPPLY — 53 items
BIT DRILL 7/64X5 DISP (BIT) ×3 IMPLANT
BLADE SAGITTAL 25.0X1.27X90 (BLADE) ×2 IMPLANT
BLADE SAGITTAL 25.0X1.27X90MM (BLADE) ×1
CAPT HIP HEMI 1 ×2 IMPLANT
CLOSURE STERI-STRIP 1/2X4 (GAUZE/BANDAGES/DRESSINGS) ×1
CLSR STERI-STRIP ANTIMIC 1/2X4 (GAUZE/BANDAGES/DRESSINGS) ×3 IMPLANT
COVER SURGICAL LIGHT HANDLE (MISCELLANEOUS) ×3 IMPLANT
DRAPE IMP U-DRAPE 54X76 (DRAPES) ×3 IMPLANT
DRAPE ORTHO SPLIT 77X108 STRL (DRAPES) ×6
DRAPE SURG ORHT 6 SPLT 77X108 (DRAPES) ×2 IMPLANT
DRAPE U-SHAPE 47X51 STRL (DRAPES) ×3 IMPLANT
DRSG MEPILEX BORDER 4X8 (GAUZE/BANDAGES/DRESSINGS) ×3 IMPLANT
DURAPREP 26ML APPLICATOR (WOUND CARE) ×3 IMPLANT
ELECT CAUTERY BLADE 6.4 (BLADE) ×3 IMPLANT
ELECT REM PT RETURN 9FT ADLT (ELECTROSURGICAL) ×3
ELECTRODE REM PT RTRN 9FT ADLT (ELECTROSURGICAL) ×1 IMPLANT
FACESHIELD WRAPAROUND (MASK) ×3 IMPLANT
FACESHIELD WRAPAROUND OR TEAM (MASK) ×1 IMPLANT
GLOVE BIO SURGEON STRL SZ 6.5 (GLOVE) ×1 IMPLANT
GLOVE BIO SURGEON STRL SZ7 (GLOVE) ×3 IMPLANT
GLOVE BIO SURGEON STRL SZ7.5 (GLOVE) IMPLANT
GLOVE BIO SURGEONS STRL SZ 6.5 (GLOVE) ×1
GLOVE BIOGEL PI IND STRL 6.5 (GLOVE) IMPLANT
GLOVE BIOGEL PI IND STRL 7.0 (GLOVE) ×1 IMPLANT
GLOVE BIOGEL PI IND STRL 8 (GLOVE) ×1 IMPLANT
GLOVE BIOGEL PI INDICATOR 6.5 (GLOVE) ×2
GLOVE BIOGEL PI INDICATOR 7.0 (GLOVE) ×2
GLOVE BIOGEL PI INDICATOR 8 (GLOVE) ×2
GOWN STRL REUS W/ TWL LRG LVL3 (GOWN DISPOSABLE) ×1 IMPLANT
GOWN STRL REUS W/ TWL XL LVL3 (GOWN DISPOSABLE) ×1 IMPLANT
GOWN STRL REUS W/TWL LRG LVL3 (GOWN DISPOSABLE) ×3
GOWN STRL REUS W/TWL XL LVL3 (GOWN DISPOSABLE) ×3
KIT BASIN OR (CUSTOM PROCEDURE TRAY) ×3 IMPLANT
KIT ROOM TURNOVER OR (KITS) ×3 IMPLANT
MANIFOLD NEPTUNE II (INSTRUMENTS) ×3 IMPLANT
NS IRRIG 1000ML POUR BTL (IV SOLUTION) ×3 IMPLANT
PACK TOTAL JOINT (CUSTOM PROCEDURE TRAY) ×3 IMPLANT
PACK UNIVERSAL I (CUSTOM PROCEDURE TRAY) ×3 IMPLANT
PAD ARMBOARD 7.5X6 YLW CONV (MISCELLANEOUS) ×6 IMPLANT
PILLOW ABDUCTION HIP (SOFTGOODS) ×3 IMPLANT
RETRIEVER SUT HEWSON (MISCELLANEOUS) ×3 IMPLANT
SUT FIBERWIRE #2 38 REV NDL BL (SUTURE) ×6
SUT MNCRL AB 4-0 PS2 18 (SUTURE) ×5 IMPLANT
SUT MON AB 2-0 CT1 36 (SUTURE) ×3 IMPLANT
SUT VIC AB 0 CT1 27 (SUTURE) ×3
SUT VIC AB 0 CT1 27XBRD ANBCTR (SUTURE) IMPLANT
SUT VIC AB 1 CT1 27 (SUTURE) ×3
SUT VIC AB 1 CT1 27XBRD ANBCTR (SUTURE) ×1 IMPLANT
SUTURE FIBERWR#2 38 REV NDL BL (SUTURE) ×2 IMPLANT
TOWEL OR 17X24 6PK STRL BLUE (TOWEL DISPOSABLE) ×3 IMPLANT
TOWEL OR 17X26 10 PK STRL BLUE (TOWEL DISPOSABLE) ×3 IMPLANT
TRAY FOLEY CATH 14FR (SET/KITS/TRAYS/PACK) IMPLANT
WATER STERILE IRR 1000ML POUR (IV SOLUTION) ×6 IMPLANT

## 2015-02-26 NOTE — Progress Notes (Signed)
Dr. Maple Hudson notified about patient having thick whitish phlegm, patient trying to cough it out. Dr. Maple Hudson said patient is same pre-op. No order given.

## 2015-02-26 NOTE — Interval H&P Note (Signed)
History and Physical Interval Note:  02/26/2015 7:21 AM  Mandy Anderson  has presented today for surgery, with the diagnosis of RIGHT HIP FRACTURE  The various methods of treatment have been discussed with the patient and family. After consideration of risks, benefits and other options for treatment, the patient has consented to  Procedure(s): ARTHROPLASTY BIPOLAR HIP (HEMIARTHROPLASTY) (Right) as a surgical intervention .  The patient's history has been reviewed, patient examined, no change in status, stable for surgery.  I have reviewed the patient's chart and labs.  Questions were answered to the patient's satisfaction.     Jeanmarc Viernes D

## 2015-02-26 NOTE — Transfer of Care (Signed)
Immediate Anesthesia Transfer of Care Note  Patient: Mandy Anderson  Procedure(s) Performed: Procedure(s): ARTHROPLASTY BIPOLAR HIP (HEMIARTHROPLASTY) (Right)  Patient Location: PACU  Anesthesia Type:Spinal  Level of Consciousness: awake and sedated  Airway & Oxygen Therapy: Patient Spontanous Breathing and Patient connected to face mask oxygen  Post-op Assessment: Report given to RN and Post -op Vital signs reviewed and stable  Post vital signs: Reviewed and stable  Last Vitals:  Filed Vitals:   02/26/15 0907  BP:   Pulse:   Temp: 36.5 C  Resp:     Complications: No apparent anesthesia complications

## 2015-02-26 NOTE — Clinical Social Work Note (Signed)
CSW contacted financial counselor Bjorn Loser [phone] in regards to patient's medicare to inform her that according to patient's DSS worker she does have Medicare, the number is [id number].  CSW gave information to financial counselor who will work on verifying patient's information.  CSW to continue to follow patient's progress towards discharge plan.  Ervin Knack. Karl Knarr, MSW, Theresia Majors [phone] 02/26/2015 10:17 AM

## 2015-02-26 NOTE — Discharge Instructions (Signed)
INSTRUCTIONS ° °o Remove items at home which could result in a fall. This includes throw rugs or furniture in walking pathways °o ICE to the affected joint every three hours while awake for 30 minutes at a time, for at least the first 3-5 days, and then as needed for pain and swelling.  Continue to use ice for pain and swelling. You may notice swelling that will progress down to the foot and ankle.  This is normal after surgery.  Elevate your leg when you are not up walking on it.   °o Continue to use the breathing machine you got in the hospital (incentive spirometer) which will help keep your temperature down.  It is common for your temperature to cycle up and down following surgery, especially at night when you are not up moving around and exerting yourself.  The breathing machine keeps your lungs expanded and your temperature down. ° ° °DIET:  As you were doing prior to hospitalization, we recommend a well-balanced diet. ° °DRESSING / WOUND CARE / SHOWERING ° °Keep the surgical dressing until follow up.  IF THE DRESSING FALLS OFF or the wound gets wet inside, change the dressing with sterile gauze.  Please use good hand washing techniques before changing the dressing.  Do not use any lotions or creams on the incision until instructed by your surgeon.   ° °ACTIVITY ° °o Increase activity slowly as tolerated, but follow the weight bearing instructions below.   °o No driving for 6 weeks or until further direction given by your physician.  You cannot drive while taking narcotics.  °o No lifting or carrying greater than 10 lbs. until further directed by your surgeon. °o Avoid periods of inactivity such as sitting longer than an hour when not asleep. This helps prevent blood clots.  °o You may return to work once you are authorized by your doctor.  ° ° °WEIGHT BEARING  ° °Weight bearing as tolerated with assist device (walker, cane, etc) as directed, use it as long as suggested by your surgeon or therapist, typically  at least 4-6 weeks. ° ° °CONSTIPATION ° °Constipation is defined medically as fewer than three stools per week and severe constipation as less than one stool per week.  Even if you have a regular bowel pattern at home, your normal regimen is likely to be disrupted due to multiple reasons following surgery.  Combination of anesthesia, postoperative narcotics, change in appetite and fluid intake all can affect your bowels.  ° °YOU MUST use at least one of the following options; they are listed in order of increasing strength to get the job done.  They are all available over the counter, and you may need to use some, POSSIBLY even all of these options:   ° °Drink plenty of fluids (prune juice may be helpful) and high fiber foods °Colace 100 mg by mouth twice a day  °Senokot for constipation as directed and as needed Dulcolax (bisacodyl), take with full glass of water  °Miralax (polyethylene glycol) once or twice a day as needed. ° °If you have tried all these things and are unable to have a bowel movement in the first 3-4 days after surgery call either your surgeon or your primary doctor.   ° °If you experience loose stools or diarrhea, hold the medications until you stool forms back up.  If your symptoms do not get better within 1 week or if they get worse, check with your doctor.  If you experience "the worst   abdominal pain ever" or develop nausea or vomiting, please contact the office immediately for further recommendations for treatment. ° ° °ITCHING:  If you experience itching with your medications, try taking only a single pain pill, or even half a pain pill at a time.  You can also use Benadryl over the counter for itching or also to help with sleep.  ° °TED HOSE STOCKINGS:  Use stockings on both legs until for at least 2 weeks or as directed by physician office. They may be removed at night for sleeping. ° °MEDICATIONS:  See your medication summary on the “After Visit Summary” that nursing will review with you.   You may have some home medications which will be placed on hold until you complete the course of blood thinner medication.  It is important for you to complete the blood thinner medication as prescribed. ° °PRECAUTIONS:  If you experience chest pain or shortness of breath - call 911 immediately for transfer to the hospital emergency department.  ° °If you develop a fever greater that 101 F, purulent drainage from wound, increased redness or drainage from wound, foul odor from the wound/dressing, or calf pain - CONTACT YOUR SURGEON.   °                                                °FOLLOW-UP APPOINTMENTS:  If you do not already have a post-op appointment, please call the office for an appointment to be seen by your surgeon.  Guidelines for how soon to be seen are listed in your “After Visit Summary”, but are typically between 1-4 weeks after surgery. ° °MAKE SURE YOU:  °• Understand these instructions.  °• Get help right away if you are not doing well or get worse.  ° ° °Thank you for letting us be a part of your medical care team.  It is a privilege we respect greatly.  We hope these instructions will help you stay on track for a fast and full recovery!  ° °

## 2015-02-26 NOTE — Op Note (Signed)
02/24/2015 - 02/26/2015  8:33 AM  PATIENT:  Mandy Anderson   MRN: [id number]  PRE-OPERATIVE DIAGNOSIS:  RIGHT HIP FRACTURE  POST-OPERATIVE DIAGNOSIS:  RIGHT HIP FRACTURE  PROCEDURE:  Procedure(s): ARTHROPLASTY BIPOLAR HIP (HEMIARTHROPLASTY)  PREOPERATIVE INDICATIONS:  Mandy Anderson is an 80 y.o. female who was admitted 02/24/2015 with a diagnosis of Closed right hip fracture and elected for surgical management.  The risks benefits and alternatives were discussed with the patient including but not limited to the risks of nonoperative treatment, versus surgical intervention including infection, bleeding, nerve injury, periprosthetic fracture, the need for revision surgery, dislocation, leg length discrepancy, blood clots, cardiopulmonary complications, morbidity, mortality, among others, and they were willing to proceed.  Predicted outcome is good, although there will be at least a six to nine month expected recovery.   OPERATIVE REPORT     SURGEON:  Edmonia Lynch, MD    ASSISTANT:  Lovett Calender, PA-C, She was present and scrubbed throughout the case, critical for completion in a timely fashion, and for retraction, instrumentation, and closure.     ANESTHESIA:  General    COMPLICATIONS:  None.      COMPONENTS:  [employer] A[employer]: Femoral stem: 2.5, Femoral Head:48, Neck:0   PROCEDURE IN DETAIL: The patient was met in the holding area and identified.  The appropriate hip  was marked at the operative site. The patient was then transported to the OR and  placed under general anesthesia.  At that point, the patient was  placed in the lateral decubitus position with the operative side up and  secured to the operating room table and all bony prominences padded.     The operative lower extremity was prepped from the iliac crest to the toes.  Sterile draping was performed.  Time out was performed prior to incision.      A routine posterolateral approach was utilized via sharp dissection  carried  down to the subcutaneous tissue.  Gross bleeders were Bovie  coagulated.  The iliotibial band was identified and incised  along the length of the skin incision.  Self-retaining retractors were  inserted.  With the hip internally rotated, the short external rotators  were identified. The piriformis was tagged with FiberWire, and the hip capsule released in a T-type fashion.  The femoral neck was exposed, and I resected the femoral neck using the appropriate jig. This was performed at approximately a thumb's breadth above the lesser trochanter.    I then exposed the deep acetabulum, cleared out any tissue including the ligamentum teres, and included the hip capsule in the FiberWire used above and below the T.    I then prepared the proximal femur using the cookie-cutter, the lateralizing reamer, and then sequentially broached.  A trial utilized, and I reduced the hip and it was found to have excellent stability with functional range of motion. The trial components were then removed.   The canal and acetabulum were thoroughly irrigated  I inserted the pressfit stem and placed the head and neck collar. The hip was reduced with appropriate force and was stable through a range of motion.   I then used a 2 mm drill bits to pass the FiberWire suture from the capsule and puriform is through the greater trochanter, and secured this. Excellent posterior capsular repair was achieved. I also closed the T in the capsule.  I then irrigated the hip copiously again with pulse lavage, and repaired the fascia with Vicryl, followed by Vicryl for the subcutaneous tissue, Monocryl for the  skin, Steri-Strips and sterile gauze. The wounds were injected. The patient was then awakened and returned to PACU in stable and satisfactory condition. There were no complications.  POST-OP PLAN: Weight bearing as tolerated. DVT px will consist of SCD's and chemical px post op  Edmonia Lynch, MD Orthopedic Surgeon [phone]    02/26/2015 8:33 AM   This note was generated using a template and dragon dictation system. In light of that, I have reviewed the note and all aspects of it are applicable to this case. Any dictation errors are due to the computerized dictation system.

## 2015-02-26 NOTE — Clinical Social Work Note (Signed)
Clinical Social Work Assessment  Patient Details  Name: Mandy Anderson MRN: [id number] Date of Birth: 1935/06/11  Date of referral:  02/26/15               Reason for consult:  Facility Placement                Permission sought to share information with:  Family Supports Permission granted to share information::  Yes, Release of Information Signed  Name::     Mandy Anderson patient's sister  Agency::  SNF Admissions  Relationship::     Contact Information:     Housing/Transportation Living arrangements for the past 2 months:  Assisted Living Facility Source of Information:  Facility, Other (Comment Required) (Patient's sister Mandy Anderson) Patient Interpreter Needed:  None Criminal Activity/Legal Involvement Pertinent to Current Situation/Hospitalization:  No - Comment as needed Significant Relationships:  Other Family Members Lives with:  Facility Resident Do you feel safe going back to the place where you live?  Yes Need for family participation in patient care:  Yes (Comment) (Patient has intellectual disability and can not make decisions for herself)  Care giving concerns: Patient may need short term rehab at a SNF before she is able to return to [facility].   Social Worker assessment / plan:  Patient is a 80 year old female who is a resident at [facility].  Patient has intellectual disability and her sister Mandy Anderson is the primary contact person.  Patient is pleasant and talkative, only alert and oriented to person though due to intellectual disability.  Patient's sister gave permission to work on discharge planning.  Patient had hip surgery today, and physician has ordered PT and OT to see her to determine what the disposition plan is.  PT and OT have not seen patient yet, but depending on recommendations patient may need short term rehab for SNF verse returning to [facility].  Employment status:  Disabled (Comment on whether or not currently receiving  Disability) Insurance information:  Medicare, [place] PT Recommendations:  Not assessed at this time Information / Referral to community resources:     Patient/Family's Response to care:  Patient's family in agreement to going to SNF if that is recommendation for patient.  Patient/Family's Understanding of and Emotional Response to Diagnosis, Current Treatment, and Prognosis:  Patient not aware of diagnosis or treatment plan, but family is understanding of prognosis.  The family is aware that she may need SNF for short term rehab.  Emotional Assessment Appearance:  Appears stated age Attitude/Demeanor/Rapport:    Affect (typically observed):  Happy, Stable, Calm Orientation:  Oriented to Self Alcohol / Substance use:  Not Applicable Psych involvement (Current and /or in the community):  No (Comment)  Discharge Needs  Concerns to be addressed:  No discharge needs identified Readmission within the last 30 days:  No Current discharge risk:  None Barriers to Discharge:  No Barriers Identified   Darleene Cleaver, LCSWA 02/26/2015, 5:04 PM

## 2015-02-26 NOTE — Care Management Note (Signed)
Case Management Note  Patient Details  Name: Mandy Anderson MRN: [id number] Date of Birth: [birth year]  Subjective/Objective:                    Action/Plan:  Awaiting PT/OT evals Expected Discharge Date:   (unknown)               Expected Discharge Plan:     In-House Referral:     Discharge planning Services     Post Acute Care Choice:    Choice offered to:     DME Arranged:    DME Agency:     HH Arranged:    HH Agency:     Status of Service:  In process, will continue to follow  Medicare Important Message Given:    Date Medicare IM Given:    Medicare IM give by:    Date Additional Medicare IM Given:    Additional Medicare Important Message give by:     If discussed at Long Length of Stay Meetings, dates discussed:    Additional Comments:  Kingsley Plan, RN 02/26/2015, 11:04 AM

## 2015-02-26 NOTE — Anesthesia Preprocedure Evaluation (Signed)
Anesthesia Evaluation  Patient identified by MRN, date of birth, ID band Patient awake    Reviewed: Allergy & Precautions, NPO status , Patient's Chart, lab work & pertinent test results  History of Anesthesia Complications Negative for: history of anesthetic complications  Airway Mallampati: II  TM Distance: >3 FB Neck ROM: Full    Dental  (+) Missing, Poor Dentition   Pulmonary    + rhonchi        Cardiovascular hypertension, Pt. on medications and Pt. on home beta blockers  Rhythm:Regular     Neuro/Psych negative neurological ROS  negative psych ROS   GI/Hepatic negative GI ROS, Neg liver ROS,   Endo/Other  negative endocrine ROS  Renal/GU negative Renal ROS     Musculoskeletal   Abdominal   Peds  Hematology  (+) anemia ,   Anesthesia Other Findings   Reproductive/Obstetrics                             Anesthesia Physical Anesthesia Plan  ASA: III  Anesthesia Plan:    Post-op Pain Management:    Induction:   Airway Management Planned:   Additional Equipment:   Intra-op Plan:   Post-operative Plan:   Informed Consent: I have reviewed the patients History and Physical, chart, labs and discussed the procedure including the risks, benefits and alternatives for the proposed anesthesia with the patient or authorized representative who has indicated his/her understanding and acceptance.   Dental advisory given  Plan Discussed with:   Anesthesia Plan Comments:         Anesthesia Quick Evaluation

## 2015-02-26 NOTE — H&P (View-Only) (Signed)
ORTHOPAEDIC CONSULTATION  REQUESTING PHYSICIAN: Janece Canterbury, MD  Chief Complaint: R hip pain  HPI: Mandy Anderson is a 80 y.o. female who complains of R hip pain after [facility]  Per reports, the patient slid from a wheelchair to the floor.  She began complaining of R hip pain thereafter.  She was transferred to the ED and found to have a subcapital fracture of the R hip.  She was place on bedrest and NPO.  At baseline, the patient is limited to nothing more than stand to pivot/transfer mobility wise.    Past Medical History  Diagnosis Date  . Retardation     Per [place] records is functional   . Hypertension    History reviewed. No pertinent past surgical history. Social History   Social History  . Marital Status: Married    Spouse Name: N/A  . Number of Children: N/A  . Years of Education: N/A   Social History Main Topics  . Smoking status: Never Smoker   . Smokeless tobacco: None  . Alcohol Use: No  . Drug Use: None  . Sexual Activity: Not Asked   Other Topics Concern  . None   Social History Narrative  . None   Family History  Problem Relation Age of Onset  . Diabetes Mother   . Heart attack Father    No Known Allergies Prior to Admission medications   Medication Sig Start Date End Date Taking? Authorizing Provider  acetaminophen (TYLENOL) 325 MG tablet Take 650 mg by mouth every 4 (four) hours as needed for mild pain, fever or headache.   Yes Historical Provider, MD  atenolol (TENORMIN) 50 MG tablet Take 50 mg by mouth 2 (two) times daily. 02/01/15  Yes Historical Provider, MD  cholecalciferol (VITAMIN D) 1000 UNITS tablet Take 3,000 Units by mouth daily.   Yes Historical Provider, MD  diphenhydrAMINE (BENADRYL) 25 mg capsule Take 25 mg by mouth every 4 (four) hours as needed for itching.   Yes Historical Provider, MD  escitalopram (LEXAPRO) 20 MG tablet Take 20 mg by mouth daily. 02/08/15  Yes Historical Provider, MD    gabapentin (NEURONTIN) 100 MG capsule Take 100 mg by mouth 2 (two) times daily. 02/01/15  Yes Historical Provider, MD  guaifenesin (ROBITUSSIN) 100 MG/5ML syrup Take 200 mg by mouth every 4 (four) hours as needed for cough.   Yes Historical Provider, MD  hydrochlorothiazide (HYDRODIURIL) 25 MG tablet Take 25 mg by mouth daily. 02/08/15  Yes Historical Provider, MD  lamoTRIgine (LAMICTAL) 100 MG tablet Take 100 mg by mouth daily. 02/08/15  Yes Historical Provider, MD  lisinopril (PRINIVIL,ZESTRIL) 10 MG tablet Take 10 mg by mouth daily. 02/16/15  Yes Historical Provider, MD  Multiple Vitamin (MULTIVITAMIN WITH MINERALS) TABS tablet Take 1 tablet by mouth daily.   Yes Historical Provider, MD  potassium chloride SA (K-DUR,KLOR-CON) 20 MEQ tablet Take 20 mEq by mouth daily. 02/05/15  Yes Historical Provider, MD  simvastatin (ZOCOR) 20 MG tablet Take 20 mg by mouth daily. 02/09/15  Yes Historical Provider, MD   Dg Knee 1-2 Views Right  02/24/2015   CLINICAL DATA:  RIGHT hip and groin pain, slid out of a chair today, has a hip fracture  EXAM: RIGHT KNEE - 1-2 VIEW  COMPARISON:  None  FINDINGS: Osseous demineralization.  Mild medial compartment joint space narrowing.  No acute fracture, dislocation or bone destruction.  No knee joint effusion.  IMPRESSION: Osseous demineralization without  acute bony abnormalities.   Electronically Signed   By: Lavonia Dana M.D.   On: 02/24/2015 17:27   Chest Portable 1 View  02/24/2015   CLINICAL DATA:  80 year old female with hip fracture. Preop chest x-ray  EXAM: PORTABLE CHEST - 1 VIEW  COMPARISON:  None.  FINDINGS: The heart size and mediastinal contours are within normal limits. Both lungs are clear. The visualized skeletal structures are unremarkable.  IMPRESSION: No active disease.   Electronically Signed   By: Anner Crete M.D.   On: 02/24/2015 23:44   Dg Hip Unilat With Pelvis 2-3 Views Right  02/24/2015   CLINICAL DATA:  Patient's lytic from a chair today with an is  experiencing persistent right hip and groin pain  EXAM: DG HIP (WITH OR WITHOUT PELVIS) 2-3V RIGHT  COMPARISON:  None in PACs  FINDINGS: The patient has sustained an acute subcapital fracture of the right hip. There is angulation at the fracture site. The femoral head remains positioned in the acetabular cup. The intertrochanteric and subtrochanteric regions appear normal. The observed portions of the right hemipelvis are unremarkable.  IMPRESSION: The patient has sustained an acute subcapital fracture of the right hip.   Electronically Signed   By: David  Martinique M.D.   On: 02/24/2015 17:27    Positive ROS: All other systems have been reviewed and were otherwise negative with the exception of those mentioned in the HPI and as above.  Labs cbc  Recent Labs  02/24/15 1735 02/24/15 2149  WBC 14.8* 12.7*  HGB 11.8* 11.3*  HCT 35.2* 34.5*  PLT 152 153    Labs inflam No results for input(s): CRP in the last 72 hours.  Invalid input(s): ESR  Labs coag No results for input(s): INR, PTT in the last 72 hours.  Invalid input(s): PT   Recent Labs  02/24/15 1735 02/24/15 2149 02/25/15 0340  NA 138  --  139  K 4.1  --  3.8  CL 100*  --  99*  CO2 27  --  30  GLUCOSE 161*  --  131*  BUN 22*  --  21*  CREATININE 1.39* 1.36* 1.39*  CALCIUM 9.7  --  9.4    Physical Exam: Filed Vitals:   02/25/15 0654  BP: 150/64  Pulse: 61  Temp: 98.2 F (36.8 C)  Resp: 20   General: Confused due to mental retardation Cardiovascular: No pedal edema Respiratory: No cyanosis, no use of accessory musculature GI: No organomegaly, abdomen is soft and non-tender Skin: No lesions in the area of chief complaint other than those listed below in MSK exam.  Neurologic: Sensation intact distally Psychiatric: confused due to mental retardation  Lymphatic: No axillary or cervical lymphadenopathy  MUSCULOSKELETAL:  R hip is tender to palpation.  Limited ROM due to pain.  Sensation intact with 2+ distal  pulses. Other extremities are atraumatic with painless ROM and NVI.  Assessment: R subcapital fracture of the femur  Plan: Tentative plan to take to OR for hemiarthroplasty of R hip once cleared by medicine team.  Will need to touch base with family or medical decision maker to see if they are agreeable with surgical intervention.  Will remain NPO till final decision about surgery.   Weight Bearing Status: bedrest   Bland Span Cell [phone]   02/25/2015 9:42 AM

## 2015-02-26 NOTE — Anesthesia Postprocedure Evaluation (Signed)
  Anesthesia Post-op Note  Patient: Mandy Anderson  Procedure(s) Performed: Procedure(s): ARTHROPLASTY BIPOLAR HIP (HEMIARTHROPLASTY) (Right)  Patient Location: PACU  Anesthesia Type:MAC and Spinal  Level of Consciousness: awake and alert   Airway and Oxygen Therapy: Patient Spontanous Breathing and Patient connected to nasal cannula oxygen  Post-op Pain: none  Post-op Assessment: Post-op Vital signs reviewed, Patient's Cardiovascular Status Stable, Respiratory Function Stable, Patent Airway, No signs of Nausea or vomiting and Pain level controlled LLE Motor Response: Purposeful movement, Responds to commands (wiggle toes) LLE Sensation: Full sensation RLE Motor Response: Purposeful movement, Responds to commands (wiggle toes) RLE Sensation: Full sensation L Sensory Level: S1-Sole of foot, small toes R Sensory Level: S1-Sole of foot, small toes  Post-op Vital Signs: Reviewed and stable  Last Vitals:  Filed Vitals:   02/26/15 1104  BP: 118/56  Pulse: 63  Temp: 36.6 C  Resp: 16    Complications: No apparent anesthesia complications

## 2015-02-26 NOTE — Progress Notes (Signed)
[facility] PROGRESS NOTE  Mandy Anderson JYN:[id number] DOB: 13-May-1935 DOA: 02/24/2015 PCP: Vinnie Langton, NP  Brief Summary  Mandy Anderson is a 80 y.o. female with history of mental retardation and hypertension who presented from [facility] after she slid to the floor from her wheelchair causing a subcapital fracture of the R hip.   Assessment/Plan  Closed right hip fracture -  Appreciate orthopedics assistance -  CXR clear and on RA, no hx of asthma or COPD -  S/p right hemiarthroplasty on 9/9 by Dr. Eulah Pont  HTN (hypertension), BP elevated likely due to pain -  Continue atenolol, HCTZ, lisinopril -  Control pain  Hyperlipidemia, stable, continue simvastatin  Intellectual disability, having difficulty reaching NOK/guardian.  Patient does not have capacity to make medical decisions - Lexapro, gabapentin and Lamictal  Hyperglycemia,  -  Check hemoglobin A1c  Leukocytosis, likely stress related and trending down -  CXR neg -  Check UA  Normocytic anemia, mild, hgb stable.  Defer to PCP  Diet:  Healthy heart  Access:  PIV IVF:  yes Proph:  lovenox  Code Status: full Family Communication: patient alone Disposition Plan: pending recovery from surgery, to SNF   Consultants:  Orthopedic surgery, Delbert Harness  Procedures:  CXR  XR hip   XR knee right  Right hemiarthoplasty  Antibiotics:  none   HPI/Subjective:  Denies CP, SOB, nausea, abdominal pain and currently her leg is not hurting very much  Objective: Filed Vitals:   02/26/15 1015 02/26/15 1030 02/26/15 1045 02/26/15 1104  BP:  106/49 106/46 118/56  Pulse: 62 61 57 63  Temp:   97 F (36.1 C) 97.9 F (36.6 C)  TempSrc:    Oral  Resp: 18 13 12 16   Height:      Weight:      SpO2: 100% 100% 100% 100%    Intake/Output Summary (Last 24 hours) at 02/26/15 1500 Last data filed at 02/26/15 0914  Gross per 24 hour  Intake   2157 ml  Output    100 ml  Net   2057 ml   Filed  Weights   02/25/15 1000  Weight: 70.7 kg (155 lb 13.8 oz)   Body mass index is 25.17 kg/(m^2).  Exam:   General:  Adult female, No acute distress  HEENT:  NCAT, MMM  Cardiovascular:  RRR, nl S1, S2 no mrg, 2+ pulses, warm extremities  Respiratory:  CTAB, no increased WOB  Abdomen:   NABS, soft, ND, NT  MSK:   Normal tone and bulk, legs braced and right hip dressing with a few small spots of dried blood on dressing, minimal swelling and bruising.    Neuro:  Grossly intact  Data Reviewed: Basic Metabolic Panel:  Recent Labs Lab 02/24/15 1735 02/24/15 2149 02/25/15 0340 02/26/15 0410  NA 138  --  139 139  K 4.1  --  3.8 4.2  CL 100*  --  99* 105  CO2 27  --  30 28  GLUCOSE 161*  --  131* 93  BUN 22*  --  21* 26*  CREATININE 1.39* 1.36* 1.39* 1.50*  CALCIUM 9.7  --  9.4 8.7*  MG 1.9  --   --   --   PHOS 3.4  --   --   --    Liver Function Tests:  Recent Labs Lab 02/24/15 1735 02/25/15 0340 02/26/15 0410  AST 33 31 21  ALT 17 17 14   ALKPHOS 55 53 41  BILITOT 0.7  0.7 0.6  PROT 8.0 7.5 6.4*  ALBUMIN 4.1 3.5 3.0*   No results for input(s): LIPASE, AMYLASE in the last 168 hours. No results for input(s): AMMONIA in the last 168 hours. CBC:  Recent Labs Lab 02/24/15 1735 02/24/15 2149  WBC 14.8* 12.7*  NEUTROABS 13.2*  --   HGB 11.8* 11.3*  HCT 35.2* 34.5*  MCV 94.6 94.0  PLT 152 153    Recent Results (from the past 240 hour(s))  Surgical pcr screen     Status: None   Collection Time: 02/25/15  4:40 AM  Result Value Ref Range Status   MRSA, PCR NEGATIVE NEGATIVE Final   Staphylococcus aureus NEGATIVE NEGATIVE Final    Comment:        The Xpert SA Assay (FDA approved for NASAL specimens in patients over 16 years of age), is one component of a comprehensive surveillance program.  Test performance has been validated by [facility] for patients greater than or equal to 20 year old. It is not intended to diagnose infection nor to guide or  monitor treatment.      Studies: Dg Knee 1-2 Views Right  02/24/2015   CLINICAL DATA:  RIGHT hip and groin pain, slid out of a chair today, has a hip fracture  EXAM: RIGHT KNEE - 1-2 VIEW  COMPARISON:  None  FINDINGS: Osseous demineralization.  Mild medial compartment joint space narrowing.  No acute fracture, dislocation or bone destruction.  No knee joint effusion.  IMPRESSION: Osseous demineralization without acute bony abnormalities.   Electronically Signed   By: Ulyses Southward M.D.   On: 02/24/2015 17:27   Pelvis Portable  02/26/2015   CLINICAL DATA:  POSTOP RIGHT HIP REPLACEMENT.  EXAM: PORTABLE PELVIS 1-2 VIEWS  COMPARISON:  02/24/2015  FINDINGS: Single frontal view demonstrates right hip hemiarthroplasty. Femoral head component appears well seated with within the acetabulum on this frontal view. No acute fractures identified.  IMPRESSION: Status post right hip arthroplasty.  No adverse features identified.   Electronically Signed   By: Norva Pavlov M.D.   On: 02/26/2015 10:20   Chest Portable 1 View  02/24/2015   CLINICAL DATA:  80 year old female with hip fracture. Preop chest x-ray  EXAM: PORTABLE CHEST - 1 VIEW  COMPARISON:  None.  FINDINGS: The heart size and mediastinal contours are within normal limits. Both lungs are clear. The visualized skeletal structures are unremarkable.  IMPRESSION: No active disease.   Electronically Signed   By: Elgie Collard M.D.   On: 02/24/2015 23:44   Dg Hip Unilat With Pelvis 2-3 Views Right  02/24/2015   CLINICAL DATA:  Patient's lytic from a chair today with an is experiencing persistent right hip and groin pain  EXAM: DG HIP (WITH OR WITHOUT PELVIS) 2-3V RIGHT  COMPARISON:  None in PACs  FINDINGS: The patient has sustained an acute subcapital fracture of the right hip. There is angulation at the fracture site. The femoral head remains positioned in the acetabular cup. The intertrochanteric and subtrochanteric regions appear normal. The observed portions  of the right hemipelvis are unremarkable.  IMPRESSION: The patient has sustained an acute subcapital fracture of the right hip.   Electronically Signed   By: David  [place]aziland M.D.   On: 02/24/2015 17:27    Scheduled Meds: . [START ON 02/27/2015] aspirin EC  325 mg Oral Q breakfast  . atenolol  50 mg Oral BID  .  ceFAZolin (ANCEF) IV  2 g Intravenous Q6H  . cholecalciferol  3,000  Units Oral Daily  . enoxaparin (LOVENOX) injection  40 mg Subcutaneous Q24H  . escitalopram  20 mg Oral Daily  . gabapentin  100 mg Oral BID  . hydrochlorothiazide  25 mg Oral Daily  . lamoTRIgine  100 mg Oral Daily  . lisinopril  10 mg Oral Daily  . multivitamin with minerals  1 tablet Oral Daily  . potassium chloride SA  20 mEq Oral Daily  . simvastatin  20 mg Oral Daily   Continuous Infusions: . sodium chloride 50 mL/hr at 02/25/15 1805    Principal Problem:   Closed right hip fracture Active Problems:   HTN (hypertension)   Hyperlipidemia   Intellectual disability   Hyperglycemia   Leukocytosis   Mild anemia    Time spent: 30 min    Lexi Conaty, [facility]  [facility] Pager [phone]. If 7PM-7AM, please contact night-coverage at [email or web address], password [email or web address] 02/26/2015, 3:00 PM  LOS: 2 days

## 2015-02-27 DIAGNOSIS — D72829 Elevated white blood cell count, unspecified: Secondary | ICD-10-CM

## 2015-02-27 DIAGNOSIS — S72001D Fracture of unspecified part of neck of right femur, subsequent encounter for closed fracture with routine healing: Secondary | ICD-10-CM

## 2015-02-27 LAB — COMPREHENSIVE METABOLIC PANEL
ALT: 12 U/L — AB (ref 14–54)
AST: 26 U/L (ref 15–41)
Albumin: 2.5 g/dL — ABNORMAL LOW (ref 3.5–5.0)
Alkaline Phosphatase: 40 U/L (ref 38–126)
Anion gap: 7 (ref 5–15)
BUN: 21 mg/dL — ABNORMAL HIGH (ref 6–20)
CHLORIDE: 101 mmol/L (ref 101–111)
CO2: 27 mmol/L (ref 22–32)
CREATININE: 1.38 mg/dL — AB (ref 0.44–1.00)
Calcium: 8.3 mg/dL — ABNORMAL LOW (ref 8.9–10.3)
GFR calc non Af Amer: 36 mL/min — ABNORMAL LOW (ref >60)
GFR, EST AFRICAN AMERICAN: 41 mL/min — AB (ref >60)
Glucose, Bld: 121 mg/dL — ABNORMAL HIGH (ref 65–99)
POTASSIUM: 4 mmol/L (ref 3.5–5.1)
SODIUM: 135 mmol/L (ref 135–145)
Total Bilirubin: 0.4 mg/dL (ref 0.3–1.2)
Total Protein: 5.9 g/dL — ABNORMAL LOW (ref 6.5–8.1)

## 2015-02-27 LAB — CBC
HEMATOCRIT: 29.3 % — AB (ref 36.0–46.0)
HEMOGLOBIN: 9.6 g/dL — AB (ref 12.0–15.0)
MCH: 31.5 pg (ref 26.0–34.0)
MCHC: 32.8 g/dL (ref 30.0–36.0)
MCV: 96.1 fL (ref 78.0–100.0)
PLATELETS: 133 10*3/uL — AB (ref 150–400)
RBC: 3.05 MIL/uL — AB (ref 3.87–5.11)
RDW: 12.2 % (ref 11.5–15.5)
WBC: 10.6 10*3/uL — ABNORMAL HIGH (ref 4.0–10.5)

## 2015-02-27 MED ORDER — DEXTROSE 5 % IV SOLN
1.0000 g | INTRAVENOUS | Status: DC
  Administered 2015-02-27 – 2015-03-02 (×4): 1 g via INTRAVENOUS
  Filled 2015-02-27 (×5): qty 10

## 2015-02-27 MED ORDER — ATENOLOL 25 MG PO TABS
25.0000 mg | ORAL_TABLET | Freq: Two times a day (BID) | ORAL | Status: DC
  Administered 2015-02-27 – 2015-03-02 (×6): 25 mg via ORAL
  Filled 2015-02-27 (×6): qty 1

## 2015-02-27 NOTE — Progress Notes (Signed)
SPORTS MEDICINE AND JOINT REPLACEMENT  Georgena Spurling, MD   Altamese Cabal, PA-C [address] [place], [place], [place]  [address]                             [phone]   PROGRESS NOTE  Subjective:  negative for Chest Pain  negative for Shortness of Breath  negative for Nausea/Vomiting   negative for Calf Pain  negative for Bowel Movement   Tolerating Diet: yes         Patient reports pain as 5 on 0-10 scale.    Objective: Vital signs in last 24 hours:   Patient Vitals for the past 24 hrs:  BP Temp Temp src Pulse Resp SpO2  02/27/15 0619 117/81 mmHg 98.4 F (36.9 C) Oral 74 16 98 %  02/27/15 0149 (!) 128/54 mmHg 98.3 F (36.8 C) Oral 72 17 98 %  02/26/15 2233 (!) 145/45 mmHg - - 74 - -  02/26/15 2040 (!) 125/54 mmHg 98.6 F (37 C) Oral 68 16 99 %  02/26/15 1510 108/60 mmHg 98 F (36.7 C) Oral (!) 58 14 100 %  02/26/15 1104 (!) 118/56 mmHg 97.9 F (36.6 C) Oral 63 16 100 %  02/26/15 1045 (!) 106/46 mmHg 97 F (36.1 C) - (!) 57 12 100 %  02/26/15 1030 (!) 106/49 mmHg - - 61 13 100 %  02/26/15 1015 - - - 62 18 100 %  02/26/15 1011 (!) 105/58 mmHg - - 64 (!) 24 100 %  02/26/15 1003 (!) 106/55 mmHg - - 61 10 100 %  02/26/15 1000 - - - 60 13 100 %  02/26/15 0945 - - - 62 19 100 %  02/26/15 0939 (!) 101/49 mmHg - - 60 14 100 %  02/26/15 0930 - - - (!) 57 15 100 %  02/26/15 0924 (!) 99/38 mmHg - - (!) 57 10 100 %  02/26/15 0916 (!) 96/46 mmHg - - (!) 57 16 100 %  02/26/15 0915 (!) 77/47 mmHg - - (!) 56 16 100 %  02/26/15 0907 (!) 90/33 mmHg 97.7 F (36.5 C) - (!) 58 16 95 %    {1959:LAST@   Intake/Output from previous day:   09/09 0701 - 09/10 0700 In: 2307 [P.O.:600; I.V.:1707] Out: 250    Intake/Output this shift:       Intake/Output      09/09 0701 - 09/10 0700 09/10 0701 - 09/11 0700   P.O. 600    I.V. (mL/kg) 1707 (24.1)    Total Intake(mL/kg) 2307 (32.6)    Urine (mL/kg/hr) 0 (0)    Stool 0 (0)    Blood 250 (0.1)    Total Output 250     Net  +2057          Urine Occurrence 10 x    Stool Occurrence 1 x       LABORATORY DATA:  Recent Labs  02/24/15 1735 02/24/15 2149 02/27/15 0410  WBC 14.8* 12.7* 10.6*  HGB 11.8* 11.3* 9.6*  HCT 35.2* 34.5* 29.3*  PLT 152 153 133*    Recent Labs  02/24/15 1735 02/24/15 2149 02/25/15 0340 02/26/15 0410 02/27/15 0410  NA 138  --  139 139 135  K 4.1  --  3.8 4.2 4.0  CL 100*  --  99* 105 101  CO2 27  --  BUN 22*  --  21* 26* 21*  CREATININE  1.39* 1.36* 1.39* 1.50* 1.38*  GLUCOSE 161*  --  131* 93 121*  CALCIUM 9.7  --  9.4 8.7* 8.3*   No results found for: INR, PROTIME  Examination:  General appearance: alert, cooperative and no distress Extremities: Homans sign is negative, no sign of DVT  Wound Exam: clean, dry, intact   Drainage:  None: wound tissue dry  Motor Exam: EHL and FHL Intact  Sensory Exam: Deep Peroneal normal   Assessment:    1 Day Post-Op  Procedure(s) (LRB): ARTHROPLASTY BIPOLAR HIP (HEMIARTHROPLASTY) (Right)  ADDITIONAL DIAGNOSIS:  Principal Problem:   Closed right hip fracture Active Problems:   HTN (hypertension)   Hyperlipidemia   Intellectual disability   Hyperglycemia   Leukocytosis   Mild anemia  Acute Blood Loss Anemia   Plan: Physical Therapy as ordered Weight Bearing as Tolerated (WBAT)  DVT Prophylaxis:  Lovenox 2 weeks 40mg  daily  DISCHARGE PLAN: Skilled Nursing Facility/Rehab           Henriette Hesser 02/27/2015, 8:51 AM

## 2015-02-27 NOTE — Evaluation (Signed)
Physical Therapy Evaluation Patient Details Name: Mandy Anderson MRN: [id number] DOB: June 11, 1935 Today's Date: 02/27/2015   History of Present Illness  80 y.o. female with Rt hip fracture, S/p right bipolar hemiarthroplasty.  Clinical Impression  Patient is s/p above procedure presenting with functional limitations due to the deficits listed below (see PT Problem List). Mod to max assist +2 for safe bed mobility and transfers. Stands with little support while using a rolling walker however pivot very challenging due to decreased ability to bear weight through RLE at this time. Tolerated exercises well. Patient will benefit from skilled PT to increase their independence and safety with mobility to allow discharge to the venue listed below.       Follow Up Recommendations SNF;Supervision/Assistance - 24 hour    Equipment Recommendations  None recommended by PT    Recommendations for Other Services       Precautions / Restrictions Precautions Precautions: Posterior Hip Precaution Booklet Issued: No Precaution Comments: Reviewed posterior hip precautions. Restrictions Weight Bearing Restrictions: Yes RLE Weight Bearing: Weight bearing as tolerated      Mobility  Bed Mobility Overal bed mobility: Needs Assistance;+2 for physical assistance Bed Mobility: Supine to Sit     Supine to sit: Max assist;+2 for physical assistance;HOB elevated     General bed mobility comments: Pt initially assisting to bring LEs off bed, however became resistive of putting weight on Rt hip and required max assist to assume seated position on EOB with assist for trunk control.  Transfers Overall transfer level: Needs assistance Equipment used: Rolling walker (2 wheeled) Transfers: Sit to/from [employer] Sit to Stand: Mod assist;+2 physical assistance Stand pivot transfers: Max assist;+2 physical assistance       General transfer comment: Good strength through LLE, but required  mod assist to weight shift anteriorly to initiate and boost to standing position. Max VC throughout. Took one small shuffle step forward with assist for RLE, but could not bear weight enough to advance LLE. Max assist +2 for attempt to pivot as pt attempts to sit prematurely. Despite cues. She was able to stand fairly unsupported for several seconds at a time, but is very limited to putting weight through RLE. Poor control of RW despite assist and cues.  Ambulation/Gait                Stairs            Wheelchair Mobility    Modified Rankin (Stroke Patients Only)       Balance Overall balance assessment: Needs assistance;History of Falls Sitting-balance support: Single extremity supported;Feet supported Sitting balance-Leahy Scale: Poor   Postural control: Left lateral lean Standing balance support: Bilateral upper extremity supported Standing balance-Leahy Scale: Poor                               Pertinent Vitals/Pain Pain Assessment: Faces Faces Pain Scale: Hurts little more Pain Location: Rt hip Pain Descriptors / Indicators: Grimacing;Guarding Pain Intervention(s): Monitored during session;Repositioned;Limited activity within patient's tolerance    Home Living Family/patient expects to be discharged to:: Skilled nursing facility Living Arrangements: Other (Comment) (from SNF) Available Help at Discharge: Skilled Nursing Facility Type of Home: Skilled Nursing Facility         Home Equipment: Wheelchair - manual Additional Comments: Pt is a poor historian of present living situation. Information pulled from notes.    Prior Function Level of Independence: Needs assistance   Gait /  Transfers Assistance Needed: Pt states she could walk prior to fall  ADL's / Homemaking Assistance Needed: Unable to state  Comments: Pt from SNF, assume she was having assistance with ADLs.     Hand Dominance        Extremity/Trunk Assessment   Upper  Extremity Assessment: Defer to OT evaluation           Lower Extremity Assessment: RLE deficits/detail RLE Deficits / Details: Decreased strength and ROM as expected post op. Somewhat limited evaluation due to cognitive impairment.       Communication   Communication: Expressive difficulties (Hisotry of intellectual disability)  Cognition Arousal/Alertness: Awake/alert Behavior During Therapy: WFL for tasks assessed/performed Overall Cognitive Status: History of cognitive impairments - at baseline                      General Comments General comments (skin integrity, edema, etc.): Reviewed hip precautions and use of abduction pillow which was replaced at end of therapy session.    Exercises Total Joint Exercises Ankle Circles/Pumps: AROM;Both;15 reps;Seated Long Arc Quad: AAROM;Strengthening;Both;10 reps;Seated      Assessment/Plan    PT Assessment Patient needs continued PT services  PT Diagnosis Difficulty walking;Acute pain   PT Problem List Decreased strength;Decreased range of motion;Decreased activity tolerance;Decreased balance;Decreased mobility;Decreased cognition;Decreased knowledge of use of DME;Decreased safety awareness;Decreased knowledge of precautions;Pain  PT Treatment Interventions DME instruction;Gait training;Functional mobility training;Therapeutic activities;Therapeutic exercise;Balance training;Neuromuscular re-education;Patient/family education;Modalities   PT Goals (Current goals can be found in the Care Plan section) Acute Rehab PT Goals Patient Stated Goal: None stated PT Goal Formulation: With patient Time For Goal Achievement: 03/06/15 Potential to Achieve Goals: Fair    Frequency Min 3X/week   Barriers to discharge        Co-evaluation               End of Session Equipment Utilized During Treatment: Gait belt Activity Tolerance: Patient limited by pain Patient left: in chair;with call bell/phone within reach;with chair  alarm set (abduction pillow) Nurse Communication: Mobility status;Precautions         Time: 6295-2841 PT Time Calculation (min) (ACUTE ONLY): 28 min   Charges:   PT Evaluation $Initial PT Evaluation Tier I: 1 Procedure PT Treatments $Therapeutic Activity: 8-22 mins   PT G CodesBerton Mount 02/27/2015, 1:06 PM Charlsie Merles, [place] [phone]

## 2015-02-27 NOTE — Progress Notes (Signed)
[facility] PROGRESS NOTE  Mandy Anderson ZOX:[id number] DOB: June 14, 1935 DOA: 02/24/2015 PCP: Vinnie Langton, NP  Brief Summary  Mandy Anderson is a 80 y.o. female with history of mental retardation and hypertension who presented from [facility] after she slid to the floor from her wheelchair causing a subcapital fracture of the R hip.   Assessment/Plan  Closed right hip fracture -  Appreciate orthopedics assistance -  CXR clear and on RA, no hx of asthma or COPD -  S/p right hemiarthroplasty on 9/9 by Dr. Eulah Pont -  Plan for lovenox for 2 weeks for DVT prophylaxis  HTN (hypertension), borderline hypotensive -  Decrease atenolol, -  Hold HCTZ, lisinopril -  Control pain  Acute hypoxic respiratory failure, likely due to atelectasis and on 2L [place] -  D/c nasal canula -  Encouraged IS this morning -  OOB   Hyperlipidemia, stable, continue simvastatin  Intellectual disability, stable - Lexapro, gabapentin and Lamictal  Hyperglycemia,  -  hemoglobin A1c pending  Leukocytosis, likely stress related due to fracture and UTI and trending down -  CXR neg -  tx UTI  UTI -  Start ceftriaxone 9/10 -  F/u urine culture  Normocytic anemia, mild, hgb stable.  Defer to PCP  Diet:  Healthy heart  Access:  PIV IVF:  off Proph:  lovenox  Code Status: full Family Communication: patient alone Disposition Plan: pending recovery from surgery, to SNF in 1-2 days   Consultants:  Orthopedic surgery, Delbert Harness  Procedures:  CXR  XR hip   XR knee right  Right hemiarthoplasty  Antibiotics:  none   HPI/Subjective:  Denies CP, SOB, nausea, abdominal pain and currently her leg is not hurting very much  Objective: Filed Vitals:   02/27/15 0149 02/27/15 0619 02/27/15 1414 02/27/15 1430  BP: 128/54 117/81 103/38 106/46  Pulse: 72 74 70   Temp: 98.3 F (36.8 C) 98.4 F (36.9 C) 98.5 F (36.9 C)   TempSrc: Oral Oral Oral   Resp: 17 16 16    Height:       Weight:      SpO2: 98% 98%      Intake/Output Summary (Last 24 hours) at 02/27/15 1645 Last data filed at 02/27/15 1300  Gross per 24 hour  Intake   1707 ml  Output      0 ml  Net   1707 ml   Filed Weights   02/25/15 1000  Weight: 70.7 kg (155 lb 13.8 oz)   Body mass index is 25.17 kg/(m^2).  Exam:   General:  Adult female, No acute distress  HEENT:  NCAT, MMM  Cardiovascular:  RRR, nl S1, S2 no mrg, 2+ pulses, warm extremities  Respiratory:  CTAB, no increased WOB  Abdomen:   NABS, soft, ND, NT  MSK:   Normal tone and bulk, legs braced and right hip dressing with a few small spots of dried blood on dressing, minimal swelling and bruising.    Neuro:  Grossly intact  Data Reviewed: Basic Metabolic Panel:  Recent Labs Lab 02/24/15 1735 02/24/15 2149 02/25/15 0340 02/26/15 0410 02/27/15 0410  NA 138  --  139 139 135  K 4.1  --  3.8 4.2 4.0  CL 100*  --  99* 105 101  CO2 27  --  30 28 27   GLUCOSE 161*  --  131* 93 121*  BUN 22*  --  21* 26* 21*  CREATININE 1.39* 1.36* 1.39* 1.50* 1.38*  CALCIUM 9.7  --  9.4 8.7* 8.3*  MG 1.9  --   --   --   --   PHOS 3.4  --   --   --   --    Liver Function Tests:  Recent Labs Lab 02/24/15 1735 02/25/15 0340 02/26/15 0410 02/27/15 0410  Anderson 33 ALT 12*  ALKPHOS 55 53 41 40  BILITOT 0.7 0.7 0.6 0.4  PROT 8.0 7.5 6.4* 5.9*  ALBUMIN 4.1 3.5 3.0* 2.5*   No results for input(s): LIPASE, AMYLASE in the last 168 hours. No results for input(s): AMMONIA in the last 168 hours. CBC:  Recent Labs Lab 02/24/15 1735 02/24/15 2149 02/27/15 0410  WBC 14.8* 12.7* 10.6*  NEUTROABS 13.2*  --   --   HGB 11.8* 11.3* 9.6*  HCT 35.2* 34.5* 29.3*  MCV 94.6 94.0 96.1  PLT 152 153 133*    Recent Results (from the past 240 hour(s))  Surgical pcr screen     Status: None   Collection Time: 02/25/15  4:40 AM  Result Value Ref Range Status   MRSA, PCR NEGATIVE NEGATIVE Final   Staphylococcus aureus NEGATIVE  NEGATIVE Final    Comment:        The Xpert SA Assay (FDA approved for NASAL specimens in patients over 64 years of age), is one component of a comprehensive surveillance program.  Test performance has been validated by [facility] for patients greater than or equal to 64 year old. It is not intended to diagnose infection nor to guide or monitor treatment.      Studies: Pelvis Portable  02/26/2015   CLINICAL DATA:  POSTOP RIGHT HIP REPLACEMENT.  EXAM: PORTABLE PELVIS 1-2 VIEWS  COMPARISON:  02/24/2015  FINDINGS: Single frontal view demonstrates right hip hemiarthroplasty. Femoral head component appears well seated with within the acetabulum on this frontal view. No acute fractures identified.  IMPRESSION: Status post right hip arthroplasty.  No adverse features identified.   Electronically Signed   By: Norva Pavlov M.D.   On: 02/26/2015 10:20    Scheduled Meds: . aspirin EC  325 mg Oral Q breakfast  . atenolol  50 mg Oral BID  . cefTRIAXone (ROCEPHIN)  IV  1 g Intravenous Q24H  . cholecalciferol  3,000 Units Oral Daily  . enoxaparin (LOVENOX) injection  40 mg Subcutaneous Q24H  . escitalopram  20 mg Oral Daily  . gabapentin  100 mg Oral BID  . hydrochlorothiazide  25 mg Oral Daily  . lamoTRIgine  100 mg Oral Daily  . lisinopril  10 mg Oral Daily  . multivitamin with minerals  1 tablet Oral Daily  . potassium chloride SA  20 mEq Oral Daily  . simvastatin  20 mg Oral Daily   Continuous Infusions: . sodium chloride 50 mL/hr at 02/27/15 1632    Principal Problem:   Closed right hip fracture Active Problems:   HTN (hypertension)   Hyperlipidemia   Intellectual disability   Hyperglycemia   Leukocytosis   Mild anemia    Time spent: 30 min    Hanne Kegg, [facility]  [facility] Pager [phone]. If 7PM-7AM, please contact night-coverage at [email or web address], password [facility] 02/27/2015, 4:45 PM  LOS: 3 days

## 2015-02-28 DIAGNOSIS — J9601 Acute respiratory failure with hypoxia: Secondary | ICD-10-CM

## 2015-02-28 DIAGNOSIS — J9811 Atelectasis: Secondary | ICD-10-CM

## 2015-02-28 LAB — BASIC METABOLIC PANEL
ANION GAP: 6 (ref 5–15)
BUN: 13 mg/dL (ref 6–20)
CHLORIDE: 101 mmol/L (ref 101–111)
CO2: 29 mmol/L (ref 22–32)
Calcium: 8.6 mg/dL — ABNORMAL LOW (ref 8.9–10.3)
Creatinine, Ser: 1.13 mg/dL — ABNORMAL HIGH (ref 0.44–1.00)
GFR, EST AFRICAN AMERICAN: 53 mL/min — AB (ref >60)
GFR, EST NON AFRICAN AMERICAN: 45 mL/min — AB (ref >60)
Glucose, Bld: 105 mg/dL — ABNORMAL HIGH (ref 65–99)
POTASSIUM: 3.8 mmol/L (ref 3.5–5.1)
SODIUM: 136 mmol/L (ref 135–145)

## 2015-02-28 LAB — URINE CULTURE: SPECIAL REQUESTS: NORMAL

## 2015-02-28 LAB — CBC
HCT: 27.7 % — ABNORMAL LOW (ref 36.0–46.0)
HEMOGLOBIN: 9.4 g/dL — AB (ref 12.0–15.0)
MCH: 32 pg (ref 26.0–34.0)
MCHC: 33.9 g/dL (ref 30.0–36.0)
MCV: 94.2 fL (ref 78.0–100.0)
PLATELETS: 142 10*3/uL — AB (ref 150–400)
RBC: 2.94 MIL/uL — AB (ref 3.87–5.11)
RDW: 12.2 % (ref 11.5–15.5)
WBC: 12 10*3/uL — AB (ref 4.0–10.5)

## 2015-02-28 MED ORDER — CEPHALEXIN 500 MG PO CAPS: 500.0000 mg | ORAL_CAPSULE | Freq: Two times a day (BID) | ORAL | Status: DC

## 2015-02-28 MED ORDER — ENOXAPARIN SODIUM 40 MG/0.4ML ~~LOC~~ SOLN: 40.0000 mg | SUBCUTANEOUS | Status: AC

## 2015-02-28 MED ORDER — ATENOLOL 25 MG PO TABS: 12.5000 mg | ORAL_TABLET | Freq: Two times a day (BID) | ORAL | Status: DC

## 2015-02-28 NOTE — Progress Notes (Signed)
SPORTS MEDICINE AND JOINT REPLACEMENT  Mandy Spurling, MD   Mandy Cabal, PA-C [address] [place], [place], [place]  [address]                             [phone]   PROGRESS NOTE  Subjective:  negative for Chest Pain  negative for Shortness of Breath  negative for Nausea/Vomiting   negative for Calf Pain  negative for Bowel Movement   Tolerating Diet: yes       Mandy Anderson is a 80 y.o. female with history of mental retardation and hypertension who presented from [facility] after she slid to the floor from her wheelchair causing a subcapital fracture of the R hip.   Objective: Vital signs in last 24 hours:   Patient Vitals for the past 24 hrs:  BP Temp Temp src Pulse Resp SpO2  02/28/15 0501 (!) 112/48 mmHg 98.2 F (36.8 C) - 70 15 98 %  02/27/15 2235 119/79 mmHg 98.1 F (36.7 C) Oral 68 16 96 %  02/27/15 2111 (!) 110/58 mmHg - - 74 - -  02/27/15 1430 (!) 106/46 mmHg - - - - -  02/27/15 1414 (!) 103/38 mmHg 98.5 F (36.9 C) Oral 70 16 -    {1959:LAST@   Intake/Output from previous day:   09/10 0701 - 09/11 0700 In: 1090 [P.O.:340; I.V.:700] Out: -    Intake/Output this shift:       Intake/Output      09/10 0701 - 09/11 0700 09/11 0701 - 09/12 0700   P.O. 340    I.V. (mL/kg) 700 (9.9)    IV Piggyback 50    Total Intake(mL/kg) 1090 (15.4)    Urine (mL/kg/hr)     Stool     Blood     Total Output       Net +1090          Urine Occurrence 5 x       LABORATORY DATA:  Recent Labs  02/24/15 1735 02/24/15 2149 02/27/15 0410 02/28/15 0510  WBC 14.8* 12.7* 10.6* 12.0*  HGB 11.8* 11.3* 9.6* 9.4*  HCT 35.2* 34.5* 29.3* 27.7*  PLT 152 153 133* 142*    Recent Labs  02/24/15 1735 02/24/15 2149 02/25/15 0340 02/26/15 0410 02/27/15 0410 02/28/15 0510  NA 138  --  139 139 135 136  K 4.1  --  3.8 4.2 4.0 3.8  CL 100*  --  99* 105 101 101  CO2 27  --  BUN 22*  --  21* 26* 21* 13  CREATININE 1.39* 1.36* 1.39* 1.50* 1.38*  1.13*  GLUCOSE 161*  --  131* 93 121* 105*  CALCIUM 9.7  --  9.4 8.7* 8.3* 8.6*   No results found for: INR, PROTIME  Examination:  General appearance: alert and cooperative Extremities: extremities normal, atraumatic, no cyanosis or edema  Wound Exam: clean, dry, intact   Drainage:  Scant/small amount Serosanguinous exudate  Sensory Exam: Deep Peroneal normal   Assessment:    2 Days Post-Op  Procedure(s) (LRB): ARTHROPLASTY BIPOLAR HIP (HEMIARTHROPLASTY) (Right)  ADDITIONAL DIAGNOSIS:  Principal Problem:   Closed right hip fracture Active Problems:   HTN (hypertension)   Hyperlipidemia   Intellectual disability   Hyperglycemia   Leukocytosis   Mild anemia     Plan: Physical Therapy as ordered Weight Bearing as Tolerated (WBAT)    DISCHARGE PLAN: Skilled Nursing Facility/Rehab  Mandy Anderson 02/28/2015, 8:40 AM

## 2015-02-28 NOTE — Social Work (Signed)
CSW faxed patient out to facilities in [facility] once PT and OT evaluations were completed. No bed offers at this time.   Beverly Sessions MSW, LCSW [phone]

## 2015-02-28 NOTE — Evaluation (Addendum)
Occupational Therapy Evaluation Patient Details Name: Mandy Anderson MRN: [id number] DOB: 10-10-1934 Today's Date: 02/28/2015    History of Present Illness 80 y.o. female with Rt hip fracture, S/p right bipolar hemiarthroplasty.   Clinical Impression   This 80 yo female admitted and underwent above presents to acute OT with decreased mobility, decreased balance, increased pain, posterior hip precautions and pre-existing cognitive deficits all affecting her ability to care for herself. She will benefit from acute OT with follow up OT at SNF.    Follow Up Recommendations  SNF    Equipment Recommendations   (TBD next venue)       Precautions / Restrictions Precautions Precautions: Posterior Hip Restrictions Weight Bearing Restrictions: No RLE Weight Bearing: Weight bearing as tolerated      Mobility Bed Mobility Overal bed mobility: Needs Assistance;+2 for physical assistance Bed Mobility: Supine to Sit     Supine to sit: Mod assist;HOB elevated        Transfers Overall transfer level: Needs assistance Equipment used: Rolling walker (2 wheeled) Transfers: Sit to/from [employer] Sit to Stand: Mod assist;+2 physical assistance Stand pivot transfers: Mod assist;+2 physical assistance            Balance Overall balance assessment: Needs assistance;History of Falls Sitting-balance support: No upper extremity supported;Feet unsupported Sitting balance-Leahy Scale: Fair     Standing balance support: Bilateral upper extremity supported Standing balance-Leahy Scale: Poor                              ADL Overall ADL's : Needs assistance/impaired Eating/Feeding: Independent;Sitting   Grooming: Set up;Sitting   Upper Body Bathing: Set up;Sitting   Lower Body Bathing: Maximal assistance (with +2 sit<>stand Mod A)   Upper Body Dressing : Set up;Supervision/safety;Sitting   Lower Body Dressing: Total assistance Lower Body Dressing  Details (indicate cue type and reason): with +2 sit<>stand Mod A Toilet Transfer: Moderate assistance;+2 for physical assistance;Stand-pivot;RW   Toileting- Clothing Manipulation and Hygiene: Total assistance (with +2 sit<>stand Mod A)               Vision Additional Comments: No change from baseline          Pertinent Vitals/Pain Pain Assessment: Faces Faces Pain Scale: Hurts little more Pain Location: right hip Pain Descriptors / Indicators: Grimacing;Guarding Pain Intervention(s): Monitored during session;Repositioned;Limited activity within patient's tolerance     Hand Dominance  right   Extremity/Trunk Assessment Upper Extremity Assessment Upper Extremity Assessment: Generalized weakness           Communication  Hard do understand at times   Cognition Arousal/Alertness: Awake/alert Behavior During Therapy: WFL for tasks assessed/performed Overall Cognitive Status: History of cognitive impairments - at baseline                                Home Living Family/patient expects to be discharged to:: Skilled nursing facility                                             OT Diagnosis: Generalized weakness;Cognitive deficits;Acute pain   OT Problem List: Decreased strength;Decreased range of motion;Impaired balance (sitting and/or standing);Pain;Decreased cognition;Decreased knowledge of precautions;Decreased knowledge of use of DME or AE   OT Treatment/Interventions: Self-care/ADL training;Patient/family education;Balance training;DME and/or AE  instruction;Therapeutic activities    OT Goals(Current goals can be found in the care plan section) Acute Rehab OT Goals Patient Stated Goal: to rest OT Goal Formulation: With patient Time For Goal Achievement: 03/07/15 Potential to Achieve Goals: Good ADL Goals Pt Will Perform Lower Body Dressing: with min assist;with adaptive equipment;sit to/from stand Pt Will Transfer to Toilet: with  min assist;stand pivot transfer;bedside commode Pt Will Perform Toileting - Clothing Manipulation and hygiene: with mod assist (with min A sit<>stand BSC) Additional ADL Goal #1: Pt will be min A for in and OOB for BADLs  OT Frequency: Min 2X/week              End of Session Equipment Utilized During Treatment: Rolling walker;Gait belt Nurse Communication: Mobility status;Need for lift equipment  Activity Tolerance: Patient tolerated treatment well Patient left: in chair;with call bell/phone within reach;with chair alarm set   Time: 4098-1191 OT Time Calculation (min): 26 min Charges:  OT General Charges $OT Visit: 1 Procedure OT Evaluation $Initial OT Evaluation Tier I: 1 Procedure OT Treatments $Self Care/Home Management : 8-22 mins  Evette Georges [phone] 02/28/2015, 11:08 AM

## 2015-02-28 NOTE — Discharge Summary (Signed)
Physician Discharge Summary  Mandy Anderson NWG:[id number] DOB: [birth year] DOA: 02/24/2015  PCP: Vinnie Langton, NP  Admit date: 02/24/2015 Discharge date: 02/28/2015  Recommendations for Outpatient Follow-up:  1. Transfer to skilled nursing facility for ongoing physical and occupational therapy 2. Weightbearing as tolerated on right lower extremity 3. Continue Lovenox 40 mg daily through 03/11/2015, then stop 4. Follow-up with  Dr. Eulah Pont in 2 weeks. Please call to schedule appointment and provide transportation to and from 5.  Continue Keflex through 03/05/2015, please follow-up on pending urine culture from 9/9.   6.  F/u pending A1c 7.  Please transition to room air in next 1-2 days  Discharge Diagnoses:  Principal Problem:   Closed right hip fracture Active Problems:   HTN (hypertension)   Hyperlipidemia   Intellectual disability   Hyperglycemia   Leukocytosis   Mild anemia   Atelectasis   Acute respiratory failure with hypoxia   Discharge Condition: Stable, improved  Diet recommendation: Healthy heart  Wt Readings from Last 3 Encounters:  02/25/15 70.7 kg (155 lb 13.8 oz)    History of present illness:  Mandy Anderson is a 80 y.o. female with history of mental retardation and hypertension who presented from [facility] after she slid to the floor from her wheelchair causing a subcapital fracture of the R hip.   Hospital Course:   Closed right hip fracture secondary to mechanical fall. She was seen by orthopedic surgery and underwent right hemiarthroplasty on 9/9 by Dr. Renaye Rakers.  She has been pretty sedating and physical and occupational therapy who recommended skilled nursing facility for ongoing rehabilitation. She should continue Lovenox for 2 weeks for DVT prophylaxis and follow-up with orthopedic surgery in about 10-14 days for reevaluation.  She may be weightbearing as tolerated on the affected extremity.  HTN (hypertension), borderline hypotensive.  Her blood  pressures remained low normal so I have discontinued her HCTZ, lisinopril. I have also decreased the dose of her atenolol.  She recovers from surgery, she may need some of her blood pressure medications resumed at a later date. Repeat blood pressure check on a daily basis, and if her blood pressures are consistently greater than 150 systolic, please notify supervising physician.   Acute hypoxic respiratory failure, likely due to atelectasis and on 2L [place].  She was given an incentive spirometer and encouraged to be OOB.  Please continue to encourage therapy and she may be able to transition to room air in the next few days.  Please continue daily O2 monitoring.    Hyperlipidemia, stable, continued simvastatin  Intellectual disability, stable, continued Lexapro, gabapentin and Lamictal  Hyperglycemia, likely due to stress from fracture, however, hemoglobin A1c pending at time of discharge.    Leukocytosis, likely stress related due to fracture and UTI.  She was given IVF and started on empiric ceftriaxone and her WBC trended down.  CXR without evidence of pneumonia.    UTI.  Started empiric ceftriaxone 9/10 and given Rx for keflex at discharge.  Please follow up pending urine culture to make sure keflex is appropriate antibiotic choice.  Continue antibiotics through 9/16, then stop.  Normocytic anemia, mild, hgb stable. Defer to PCP  Consultants:  Orthopedic surgery, Delbert Harness  Procedures:  CXR  XR hip   XR knee right  Right hemiarthoplasty  Antibiotics:  none  Discharge Exam: Filed Vitals:   02/28/15 0501  BP: 112/48  Pulse: 70  Temp: 98.2 F (36.8 C)  Resp: 15   Filed Vitals:  02/27/15 1430 02/27/15 2111 02/27/15 2235 02/28/15 0501  BP: 106/46 110/58 119/79 112/48  Pulse:  74 68 70  Temp:   98.1 F (36.7 C) 98.2 F (36.8 C)  TempSrc:   Oral   Resp:   16 15  Height:      Weight:      SpO2:   96% 98%     General: Adult female, No acute  distress  HEENT: NCAT, MMM  Cardiovascular: RRR, nl S1, S2 no mrg, 2+ pulses, warm extremities  Respiratory: CTAB, no increased WOB  Abdomen: NABS, soft, ND, NT  MSK: Normal tone and bulk, legs braced and right hip dressing with a few small spots of dried blood on dressing, minimal swelling and bruising.   Neuro: Sensation intact RLE  Discharge Instructions      Discharge Instructions    Call MD for:  difficulty breathing, headache or visual disturbances    Complete by:  As directed      Call MD for:  extreme fatigue    Complete by:  As directed      Call MD for:  hives    Complete by:  As directed      Call MD for:  persistant dizziness or light-headedness    Complete by:  As directed      Call MD for:  persistant nausea and vomiting    Complete by:  As directed      Call MD for:  redness, tenderness, or signs of infection (pain, swelling, redness, odor or green/yellow discharge around incision site)    Complete by:  As directed      Call MD for:  severe uncontrolled pain    Complete by:  As directed      Call MD for:  temperature >100.4    Complete by:  As directed      Diet - low sodium heart healthy    Complete by:  As directed      Increase activity slowly    Complete by:  As directed      Leave dressing on - Keep it clean, dry, and intact until clinic visit    Complete by:  As directed      Weight bearing as tolerated    Complete by:  As directed   Laterality:  bilateral  Extremity:  Lower            Medication List    STOP taking these medications        hydrochlorothiazide 25 MG tablet  Commonly known as:  HYDRODIURIL     lisinopril 10 MG tablet  Commonly known as:  PRINIVIL,ZESTRIL      TAKE these medications        acetaminophen 325 MG tablet  Commonly known as:  TYLENOL  Take 650 mg by mouth every 4 (four) hours as needed for mild pain, fever or headache.     atenolol 25 MG tablet  Commonly known as:  TENORMIN  Take 0.5 tablets  (12.5 mg total) by mouth 2 (two) times daily.     cephALEXin 500 MG capsule  Commonly known as:  KEFLEX  Take 1 capsule (500 mg total) by mouth 2 (two) times daily.     cholecalciferol 1000 UNITS tablet  Commonly known as:  VITAMIN D  Take 3,000 Units by mouth daily.     diphenhydrAMINE 25 mg capsule  Commonly known as:  BENADRYL  Take 25 mg by mouth every 4 (four) hours as needed for  itching.     enoxaparin 40 MG/0.4ML injection  Commonly known as:  LOVENOX  Inject 0.4 mLs (40 mg total) into the skin daily.     escitalopram 20 MG tablet  Commonly known as:  LEXAPRO  Take 20 mg by mouth daily.     gabapentin 100 MG capsule  Commonly known as:  NEURONTIN  Take 100 mg by mouth 2 (two) times daily.     guaifenesin 100 MG/5ML syrup  Commonly known as:  ROBITUSSIN  Take 200 mg by mouth every 4 (four) hours as needed for cough.     HYDROcodone-acetaminophen 5-325 MG per tablet  Commonly known as:  NORCO  Take 1-2 tablets by mouth every 6 (six) hours as needed for moderate pain.     lamoTRIgine 100 MG tablet  Commonly known as:  LAMICTAL  Take 100 mg by mouth daily.     multivitamin with minerals Tabs tablet  Take 1 tablet by mouth daily.     potassium chloride SA 20 MEQ tablet  Commonly known as:  K-DUR,KLOR-CON  Take 20 mEq by mouth daily.     simvastatin 20 MG tablet  Commonly known as:  ZOCOR  Take 20 mg by mouth daily.       Follow-up Information    Follow up with MURPHY, TIMOTHY D, MD In 1 week.   Specialty:  Orthopedic Surgery   Contact information:   [address] [place] [address] [phone]       Follow up with Vinnie Langton, NP. Schedule an appointment as soon as possible for a visit in 2 weeks.   Specialty:  Nurse Practitioner   Contact information:   [address] [place] [address] [phone]        The results of significant diagnostics from this hospitalization (including imaging, microbiology, ancillary and  laboratory) are listed below for reference.    Significant Diagnostic Studies: Dg Knee 1-2 Views Right  02/24/2015   CLINICAL DATA:  RIGHT hip and groin pain, slid out of a chair today, has a hip fracture  EXAM: RIGHT KNEE - 1-2 VIEW  COMPARISON:  None  FINDINGS: Osseous demineralization.  Mild medial compartment joint space narrowing.  No acute fracture, dislocation or bone destruction.  No knee joint effusion.  IMPRESSION: Osseous demineralization without acute bony abnormalities.   Electronically Signed   By: Ulyses Southward M.D.   On: 02/24/2015 17:27   Pelvis Portable  02/26/2015   CLINICAL DATA:  POSTOP RIGHT HIP REPLACEMENT.  EXAM: PORTABLE PELVIS 1-2 VIEWS  COMPARISON:  02/24/2015  FINDINGS: Single frontal view demonstrates right hip hemiarthroplasty. Femoral head component appears well seated with within the acetabulum on this frontal view. No acute fractures identified.  IMPRESSION: Status post right hip arthroplasty.  No adverse features identified.   Electronically Signed   By: Norva Pavlov M.D.   On: 02/26/2015 10:20   Chest Portable 1 View  02/24/2015   CLINICAL DATA:  80 year old female with hip fracture. Preop chest x-ray  EXAM: PORTABLE CHEST - 1 VIEW  COMPARISON:  None.  FINDINGS: The heart size and mediastinal contours are within normal limits. Both lungs are clear. The visualized skeletal structures are unremarkable.  IMPRESSION: No active disease.   Electronically Signed   By: Elgie Collard M.D.   On: 02/24/2015 23:44   Dg Hip Unilat With Pelvis 2-3 Views Right  02/24/2015   CLINICAL DATA:  Patient's lytic from a chair today with an is experiencing persistent right  hip and groin pain  EXAM: DG HIP (WITH OR WITHOUT PELVIS) 2-3V RIGHT  COMPARISON:  None in PACs  FINDINGS: The patient has sustained an acute subcapital fracture of the right hip. There is angulation at the fracture site. The femoral head remains positioned in the acetabular cup. The intertrochanteric and subtrochanteric  regions appear normal. The observed portions of the right hemipelvis are unremarkable.  IMPRESSION: The patient has sustained an acute subcapital fracture of the right hip.   Electronically Signed   By: David  [place] M.D.   On: 02/24/2015 17:27    Microbiology: Recent Results (from the past 240 hour(s))  Surgical pcr screen     Status: None   Collection Time: 02/25/15  4:40 AM  Result Value Ref Range Status   MRSA, PCR NEGATIVE NEGATIVE Final   Staphylococcus aureus NEGATIVE NEGATIVE Final    Comment:        The Xpert SA Assay (FDA approved for NASAL specimens in patients over 5 years of age), is one component of a comprehensive surveillance program.  Test performance has been validated by [facility] for patients greater than or equal to 37 year old. It is not intended to diagnose infection nor to guide or monitor treatment.   Urine culture     Status: None (Preliminary result)   Collection Time: 02/26/15  4:16 PM  Result Value Ref Range Status   Specimen Description URINE, RANDOM  Final   Special Requests Normal  Final   Culture TOO YOUNG TO READ  Final   Report Status PENDING  Incomplete     Labs: Basic Metabolic Panel:  Recent Labs Lab 02/24/15 1735 02/24/15 2149 02/25/15 0340 02/26/15 0410 02/27/15 0410 02/28/15 0510  NA 138  --  139 139 135 136  K 4.1  --  3.8 4.2 4.0 3.8  CL 100*  --  99* 105 101 101  CO2 27  --  30 28 27 29   GLUCOSE 161*  --  131* 93 121* 105*  BUN 22*  --  21* 26* 21* 13  CREATININE 1.39* 1.36* 1.39* 1.50* 1.38* 1.13*  CALCIUM 9.7  --  9.4 8.7* 8.3* 8.6*  MG 1.9  --   --   --   --   --   PHOS 3.4  --   --   --   --   --    Liver Function Tests:  Recent Labs Lab 02/24/15 1735 02/25/15 0340 02/26/15 0410 02/27/15 0410  AST 33 31 21 26   ALT 17 17 14  12*  ALKPHOS 55 53 41 40  BILITOT 0.7 0.7 0.6 0.4  PROT 8.0 7.5 6.4* 5.9*  ALBUMIN 4.1 3.5 3.0* 2.5*   No results for input(s): LIPASE, AMYLASE in the last 168 hours. No results  for input(s): AMMONIA in the last 168 hours. CBC:  Recent Labs Lab 02/24/15 1735 02/24/15 2149 02/27/15 0410 02/28/15 0510  WBC 14.8* 12.7* 10.6* 12.0*  NEUTROABS 13.2*  --   --   --   HGB 11.8* 11.3* 9.6* 9.4*  HCT 35.2* 34.5* 29.3* 27.7*  MCV 94.6 94.0 96.1 94.2  PLT 152 153 133* 142*   Cardiac Enzymes: No results for input(s): CKTOTAL, CKMB, CKMBINDEX, TROPONINI in the last 168 hours. BNP: BNP (last 3 results) No results for input(s): BNP in the last 8760 hours.  ProBNP (last 3 results) No results for input(s): PROBNP in the last 8760 hours.  CBG: No results for input(s): GLUCAP in the last 168 hours.  Time coordinating discharge: 35 minutes  Signed:  Lessa Huge  [facility] 02/28/2015, 1:35 PM

## 2015-03-01 ENCOUNTER — Encounter (HOSPITAL_COMMUNITY): Payer: Self-pay | Admitting: Orthopedic Surgery

## 2015-03-01 DIAGNOSIS — D62 Acute posthemorrhagic anemia: Secondary | ICD-10-CM

## 2015-03-01 LAB — HEMOGLOBIN A1C
HEMOGLOBIN A1C: 5.6 % (ref 4.8–5.6)
MEAN PLASMA GLUCOSE: 114 mg/dL

## 2015-03-01 LAB — CBC
HEMATOCRIT: 24.2 % — AB (ref 36.0–46.0)
HEMOGLOBIN: 8 g/dL — AB (ref 12.0–15.0)
MCH: 31.4 pg (ref 26.0–34.0)
MCHC: 33.1 g/dL (ref 30.0–36.0)
MCV: 94.9 fL (ref 78.0–100.0)
Platelets: 170 10*3/uL (ref 150–400)
RBC: 2.55 MIL/uL — ABNORMAL LOW (ref 3.87–5.11)
RDW: 12.3 % (ref 11.5–15.5)
WBC: 10.6 10*3/uL — AB (ref 4.0–10.5)

## 2015-03-01 NOTE — Progress Notes (Signed)
Patient seen and examined.  Eating better and states she feels well.  VSS.  Exam unchanged with minimal bruising along right hip, no bleeding, erythema, induraction.  Labs notable for progressive anemia but not to transfusion threshold of /dl.  No changes to discharge medication list.  Ready to d/c to SNF which will happen on Tuesday when family can sign paperwork.

## 2015-03-01 NOTE — Progress Notes (Signed)
Occupational Therapy Treatment Patient Details Name: Mandy Anderson MRN: [id number] DOB: April 07, 1935 Today's Date: 03/01/2015    History of present illness 80 y.o. female with Rt hip fracture, S/p right bipolar hemiarthroplasty.   OT comments  Focus of session included reviewing R posterior hip precautions, bed mobility, transfers and grooming required for ADL independence. No adaptive equipment used at this time due to pt's cognitive deficits in memory recall and sequencing which would impede pt's ability to utilize adaptive equipment effectively and safely.  Follow Up Recommendations  SNF    Equipment Recommendations  Other (comment) (TBD next venue)    Recommendations for Other Services      Precautions / Restrictions Precautions Precautions: Fall;Posterior Hip Precaution Booklet Issued: No Precaution Comments: Reviewed posterior hip precautions, however therapist questions pt's compliance due to cognitive deficits Restrictions Weight Bearing Restrictions: No RLE Weight Bearing: Weight bearing as tolerated       Mobility Bed Mobility Overal bed mobility: Needs Assistance;+2 for physical assistance Bed Mobility: Supine to Sit     Supine to sit: +2 for physical assistance;Min assist;HOB elevated     General bed mobility comments: Pt required min A with trunk elevation off bed and min A with positioning R LE off of bed.  Transfers Overall transfer level: Needs assistance Equipment used: Rolling walker (2 wheeled) Transfers: Sit to/from [employer] Sit to Stand: +2 physical assistance;Mod assist Stand pivot transfers: +2 physical assistance;Min assist       General transfer comment: Pt required verbal cueing for hand placement when performing sit to stand transfer. Pt requires UE support using walker while standing, pt performed stand to sit transfer to chair with min A and verbal cues for hand placement    Balance                                    ADL Overall ADL's : Needs assistance/impaired     Grooming: Wash/dry face;Set up;Supervision/safety;Sitting                                        Vision                     Perception     Praxis      Cognition   Behavior During Therapy: WFL for tasks assessed/performed Overall Cognitive Status: History of cognitive impairments - at baseline       Memory: Decreased short-term memory               Extremity/Trunk Assessment               Exercises     Shoulder Instructions       General Comments      Pertinent Vitals/ Pain       Pain Assessment: 0-10 Pain Score: 5  Pain Location: R hip and thigh Pain Descriptors / Indicators: Grimacing Pain Intervention(s): Repositioned  Home Living                                          Prior Functioning/Environment              Frequency Min 2X/week     Progress Toward Goals  OT Goals(current  goals can now be found in the care plan section)     Acute Rehab OT Goals Patient Stated Goal: none stated OT Goal Formulation: With patient Time For Goal Achievement: 03/07/15 Potential to Achieve Goals: Good ADL Goals Pt Will Perform Lower Body Dressing: with min assist;with adaptive equipment;sit to/from stand Pt Will Transfer to Toilet: with min assist;stand pivot transfer;bedside commode Pt Will Perform Toileting - Clothing Manipulation and hygiene: with mod assist Additional ADL Goal #1: Pt will be min A for in and OOB for BADLs  Plan Discharge plan remains appropriate    Co-evaluation                 End of Session Equipment Utilized During Treatment: Gait belt;Rolling walker   Activity Tolerance Patient tolerated treatment well   Patient Left in chair;with call bell/phone within reach;with chair alarm set   Nurse Communication Mobility status (+2 mod assit for transfers)        Time: 1610-9604 OT Time Calculation (min): 23  min  Charges: OT General Charges $OT Visit: 1 Procedure OT Treatments $Self Care/Home Management : 23-37 mins  Smiley Houseman 03/01/2015, 10:11 AM

## 2015-03-01 NOTE — Progress Notes (Signed)
Physical Therapy Treatment Patient Details Name: Mandy Anderson MRN: [id number] DOB: 1935-04-23 Today's Date: 03/01/2015    History of Present Illness 80 y.o. female with Rt hip fracture, S/p right bipolar hemiarthroplasty.    PT Comments    Patient progressing slowly towards PT goals. Tolerated gait training with Mod A and multimodal cues to assist with gait sequencing. Exhibits posterior trunk/hip lean throughout mobility despite cues. Instructed pt in exercises with hands on assist/imitation due to cognitive deficits. Not able to recall hip precautions secondary to cognition. Appropriate for ST SNF. Will follow acutely per current POC.  Follow Up Recommendations  SNF;Supervision/Assistance - 24 hour     Equipment Recommendations  None recommended by PT    Recommendations for Other Services       Precautions / Restrictions Precautions Precautions: Fall;Posterior Hip Precaution Booklet Issued: No Precaution Comments: Reviewed posterior hip precautions, however therapist questions pt's compliance due to cognitive deficits Required Braces or Orthoses: Other Brace/Splint Other Brace/Splint: hip abduction wedge Restrictions Weight Bearing Restrictions: Yes RLE Weight Bearing: Weight bearing as tolerated    Mobility  Bed Mobility Overal bed mobility: Needs Assistance;+2 for physical assistance Bed Mobility: Supine to Sit     Supine to sit: +2 for physical assistance;Min assist;HOB elevated     General bed mobility comments: Sitting in chair upon PT arrival.   Transfers Overall transfer level: Needs assistance Equipment used: Rolling walker (2 wheeled) Transfers: Sit to/from Stand Sit to Stand: Mod assist Stand pivot transfers: +2 physical assistance;Min assist       General transfer comment: Cues for hand placement/technique. Posterior lean upon standing. Cues to transfer hands from chair to RW requiring assist to keep balance. Min A for controlled descent into  chair.  Ambulation/Gait Ambulation/Gait assistance: Mod assist Ambulation Distance (Feet): 8 Feet Assistive device: Rolling walker (2 wheeled) Gait Pattern/deviations: Step-to pattern;Decreased step length - left;Decreased stance time - right;Decreased weight shift to right;Shuffle;Leaning posteriorly;Decreased stride length Gait velocity: decreased   General Gait Details: Difficulty advancing LLE. Requiring tactile and verbal cues for gait sequencing and assist to navigate RW. Posterior trunk/hip lean throughout gait.   Stairs            Wheelchair Mobility    Modified Rankin (Stroke Patients Only)       Balance Overall balance assessment: Needs assistance Sitting-balance support: Feet supported;No upper extremity supported Sitting balance-Leahy Scale: Fair     Standing balance support: During functional activity Standing balance-Leahy Scale: Poor Standing balance comment: Relient on RW for support and external support for dynamic standing activities.                     Cognition Arousal/Alertness: Awake/alert Behavior During Therapy: WFL for tasks assessed/performed Overall Cognitive Status: History of cognitive impairments - at baseline       Memory: Decreased short-term memory              Exercises Total Joint Exercises Ankle Circles/Pumps: Both;10 reps;Seated Long Arc Quad: Right;10 reps;Seated    General Comments        Pertinent Vitals/Pain Pain Assessment: Faces Pain Score: 5  Faces Pain Scale: Hurts a little bit Pain Location: right hip Pain Descriptors / Indicators: Grimacing;Guarding Pain Intervention(s): Monitored during session;Repositioned    Home Living                      Prior Function            PT Goals (current goals can  now be found in the care plan section) Acute Rehab PT Goals Patient Stated Goal: none stated Progress towards PT goals: Progressing toward goals    Frequency  Min 3X/week    PT  Plan Current plan remains appropriate    Co-evaluation             End of Session Equipment Utilized During Treatment: Gait belt Activity Tolerance: Patient tolerated treatment well Patient left: in chair;with call bell/phone within reach;with chair alarm set;Other (comment) (abduction wedge)     Time: 1191-4782 PT Time Calculation (min) (ACUTE ONLY): 17 min  Charges:  $Gait Training: 8-22 mins                    G Codes:      Tamma Brigandi A Dejanae Helser 03/01/2015, 12:24 PM Mylo Red, PT, DPT [phone]

## 2015-03-01 NOTE — Care Management Important Message (Signed)
Important Message  Patient Details  Name: Mandy Anderson MRN: [id number] Date of Birth: 08-29-1934   Medicare Important Message Given:  Yes-second notification given    Bernadette Hoit 03/01/2015, 12:35 PM

## 2015-03-01 NOTE — Progress Notes (Signed)
     Subjective:  POD#3 R hip hemiarthroplasty. Patient reports pain as mild.  Resting comfortably in bed this morning.  Plan for discharge to SNF once bed is available. Continues to work with PT to help with mobilization.    Objective:   VITALS:   Filed Vitals:   02/28/15 0501 02/28/15 1340 02/28/15 2127 03/01/15 0647  BP: 112/48 101/47 129/41 124/54  Pulse: 70 72 80 76  Temp: 98.2 F (36.8 C) 98.1 F (36.7 C) 98.3 F (36.8 C) 97.9 F (36.6 C)  TempSrc:  Oral Oral   Resp: Height:      Weight:      SpO2: 98% 96% 100% 95%   Mental disability limits participation in exam ABD soft Neurovascular intact Sensation intact distally Intact pulses distally Dorsiflexion/Plantar flexion intact Incision: dressing C/D/I   Lab Results  Component Value Date   WBC 10.6* 03/01/2015   HGB 8.0* 03/01/2015   HCT 24.2* 03/01/2015   MCV 94.9 03/01/2015   PLT 170 03/01/2015   BMET    Component Value Date/Time   NA 136 02/28/2015 0510   K 3.8 02/28/2015 0510   CL 101 02/28/2015 0510   CO2 29 02/28/2015 0510   GLUCOSE 105* 02/28/2015 0510   BUN 13 02/28/2015 0510   CREATININE 1.13* 02/28/2015 0510   CALCIUM 8.6* 02/28/2015 0510   GFRNONAA 45* 02/28/2015 0510   GFRAA 53* 02/28/2015 0510     Assessment/Plan: 3 Days Post-Op   Principal Problem:   Closed right hip fracture Active Problems:   HTN (hypertension)   Hyperlipidemia   Intellectual disability   Hyperglycemia   Leukocytosis   Mild anemia   Atelectasis   Acute respiratory failure with hypoxia   Up with therapy WBAT in the RLE with posterior hip precautions for 6 weeks post-op Lovenox for DVT prophylaxis Ok from ortho standpoint to transfer to SNF once bed is available.  Will see back in clinic in 2 weeks for post-op evaluation.    Mandy Anderson 03/01/2015, 7:44 AM Cell [phone]

## 2015-03-01 NOTE — Clinical Social Work Note (Signed)
CSW spoke to [facility], patient's family would like her to go to SNF.  [facility] stated they will have a bed available and can take patient on Tuesday once family have filled out paperwork for patient.  CSW spoke to patient's sister Mandy Anderson who will meet with admissions at [facility] tomorrow at 8:30am to complete paperwork.  CSW to continue to follow patient's progress towards discharge planning.  Ervin Knack. Mandy Anderson, MSW, Theresia Majors [phone] 03/01/2015 4:45 PM

## 2015-03-02 DIAGNOSIS — J9811 Atelectasis: Secondary | ICD-10-CM

## 2015-03-02 DIAGNOSIS — J9601 Acute respiratory failure with hypoxia: Secondary | ICD-10-CM

## 2015-03-02 NOTE — Clinical Social Work Note (Signed)
Patient to be d/c'ed today to [facility].  Patient and family agreeable to plans will transport via ems RN to call report.  Message left on patient's sister Josephine's voice mail.  CSW notified [facility] that patient will be going to SNF for short term rehab.  Windell Moulding, MSW, [facility] Majors [phone]

## 2015-03-02 NOTE — Progress Notes (Signed)
Physical Therapy Treatment Patient Details Name: Mandy Anderson MRN: [id number] DOB: Sep 20, 1934 Today's Date: 03/02/2015    History of Present Illness 80 y.o. female with Rt hip fracture, S/p right bipolar hemiarthroplasty.    PT Comments    Pt with decreased posterior lean with gait today.  Decreased step length and requires recliner follow, but able to ambulate 8' with MOD A and RW. Con't to recommend SNF.  Follow Up Recommendations  SNF;Supervision/Assistance - 24 hour     Equipment Recommendations  None recommended by PT    Recommendations for Other Services       Precautions / Restrictions Precautions Precautions: Fall;Posterior Hip Precaution Comments: Reviewed posterior hip precautions, however therapist questions pt's compliance due to cognitive deficits Required Braces or Orthoses: Other Brace/Splint Other Brace/Splint: hip abduction wedge Restrictions RLE Weight Bearing: Weight bearing as tolerated    Mobility  Bed Mobility Overal bed mobility: Needs Assistance;+2 for physical assistance Bed Mobility: Supine to Sit     Supine to sit: Mod assist     General bed mobility comments: Pt attempting to get LE to EOB.  Use of bed pad to get hips turned..  Transfers Overall transfer level: Needs assistance Equipment used: Rolling walker (2 wheeled) Transfers: Sit to/from Stand Sit to Stand: +2 physical assistance;Min assist         General transfer comment: cues for technique  Ambulation/Gait Ambulation/Gait assistance: Mod assist;+2 safety/equipment Ambulation Distance (Feet): 8 Feet Assistive device: Rolling walker (2 wheeled) Gait Pattern/deviations: Step-to pattern;Antalgic;Decreased step length - right;Decreased step length - left Gait velocity: decreased   General Gait Details: Difficulty with Wb through R LE and advancing L LE. multi modal cueing.   Stairs            Wheelchair Mobility    Modified Rankin (Stroke Patients Only)       Balance   Sitting-balance support: Feet supported Sitting balance-Leahy Scale: Fair     Standing balance support: Bilateral upper extremity supported Standing balance-Leahy Scale: Poor                      Cognition Arousal/Alertness: Awake/alert Behavior During Therapy: WFL for tasks assessed/performed Overall Cognitive Status: History of cognitive impairments - at baseline       Memory: Decreased short-term memory              Exercises Total Joint Exercises Ankle Circles/Pumps: Both;10 reps;Seated Hip ABduction/ADduction: Right;5 reps;AAROM;Seated Long Arc Quad: Right;10 reps;Seated    General Comments General comments (skin integrity, edema, etc.): Reviewed hip precautions with pt.      Pertinent Vitals/Pain Pain Assessment: Faces Faces Pain Scale: Hurts little more Pain Location: R hip with mobility Pain Descriptors / Indicators: Operative site guarding;Grimacing Pain Intervention(s): Repositioned;Limited activity within patient's tolerance    Home Living                      Prior Function            PT Goals (current goals can now be found in the care plan section) Acute Rehab PT Goals Patient Stated Goal: none stated PT Goal Formulation: With patient Time For Goal Achievement: 03/06/15 Potential to Achieve Goals: Fair Progress towards PT goals: Progressing toward goals    Frequency  Min 3X/week    PT Plan Current plan remains appropriate    Co-evaluation             End of Session Equipment Utilized During Treatment: Gait belt  Activity Tolerance: Patient tolerated treatment well Patient left: in chair;with call bell/phone within reach;with nursing/sitter in room;with chair alarm set;Other (comment) (with abduction wedge)     Time: 7341-9379 PT Time Calculation (min) (ACUTE ONLY): 17 min  Charges:  $Gait Training: 8-22 mins                    G Codes:      Dammon Makarewicz LUBECK 03/02/2015, 9:34 AM

## 2015-03-02 NOTE — Discharge Planning (Signed)
Patient discharged to SNF in stable condition.  

## 2015-03-02 NOTE — Progress Notes (Signed)
Patient seen and examined, sitting in chair today.  Able to roll side to side and get in chair today without significant pain.  VSS.  Exam unchanged with minimal bruising along right hip, no bleeding, erythema, induraction.  RRR, CTAB. No labs today.  No changes to discharge medication list.  Ready to d/c to SNF.

## 2015-03-03 ENCOUNTER — Encounter: Payer: Self-pay | Admitting: Adult Health

## 2015-03-03 ENCOUNTER — Non-Acute Institutional Stay (SKILLED_NURSING_FACILITY): Payer: Medicare Other | Admitting: Adult Health

## 2015-03-03 DIAGNOSIS — N390 Urinary tract infection, site not specified: Secondary | ICD-10-CM | POA: Diagnosis not present

## 2015-03-03 DIAGNOSIS — D62 Acute posthemorrhagic anemia: Secondary | ICD-10-CM

## 2015-03-03 DIAGNOSIS — I10 Essential (primary) hypertension: Secondary | ICD-10-CM | POA: Diagnosis not present

## 2015-03-03 DIAGNOSIS — F329 Major depressive disorder, single episode, unspecified: Secondary | ICD-10-CM | POA: Diagnosis not present

## 2015-03-03 DIAGNOSIS — E785 Hyperlipidemia, unspecified: Secondary | ICD-10-CM | POA: Diagnosis not present

## 2015-03-03 DIAGNOSIS — S72001S Fracture of unspecified part of neck of right femur, sequela: Secondary | ICD-10-CM | POA: Diagnosis not present

## 2015-03-03 DIAGNOSIS — F79 Unspecified intellectual disabilities: Secondary | ICD-10-CM | POA: Diagnosis not present

## 2015-03-03 DIAGNOSIS — F32A Depression, unspecified: Secondary | ICD-10-CM

## 2015-03-05 ENCOUNTER — Non-Acute Institutional Stay (SKILLED_NURSING_FACILITY): Payer: Medicare Other | Admitting: Internal Medicine

## 2015-03-05 DIAGNOSIS — R5381 Other malaise: Secondary | ICD-10-CM

## 2015-03-05 DIAGNOSIS — R1310 Dysphagia, unspecified: Secondary | ICD-10-CM | POA: Diagnosis not present

## 2015-03-05 DIAGNOSIS — D72829 Elevated white blood cell count, unspecified: Secondary | ICD-10-CM | POA: Diagnosis not present

## 2015-03-05 DIAGNOSIS — I10 Essential (primary) hypertension: Secondary | ICD-10-CM

## 2015-03-05 DIAGNOSIS — E785 Hyperlipidemia, unspecified: Secondary | ICD-10-CM | POA: Diagnosis not present

## 2015-03-05 DIAGNOSIS — N390 Urinary tract infection, site not specified: Secondary | ICD-10-CM

## 2015-03-05 DIAGNOSIS — S72001D Fracture of unspecified part of neck of right femur, subsequent encounter for closed fracture with routine healing: Secondary | ICD-10-CM | POA: Diagnosis not present

## 2015-03-05 DIAGNOSIS — F79 Unspecified intellectual disabilities: Secondary | ICD-10-CM | POA: Diagnosis not present

## 2015-03-05 DIAGNOSIS — D62 Acute posthemorrhagic anemia: Secondary | ICD-10-CM

## 2015-03-05 NOTE — Progress Notes (Signed)
Patient ID: Mandy Anderson, female   DOB: 1935-03-08, 80 y.o.   MRN: [id number]     [facility]   PCP: Vinnie Langton, NP  Code Status: dnr  No Known Allergies  Chief Complaint  Patient presents with  . New Admit To SNF     HPI:  80 y.o. patient is here for short term rehabilitation post hospital admission from 02/24/15-02/28/15 with closed rig hip fracture post fall. She underwent right hip hemiarthroplasty. She is seen in her room today. She has intellectual disability limiting her participation in HPI and ROS. She is alert and oriented. Pain under control with current regimen.   Review of Systems:  Unable to obtain    Past Medical History  Diagnosis Date  . Retardation     Per [facility] records is functional   . Hypertension    Past Surgical History  Procedure Laterality Date  . Hip arthroplasty Right 02/26/2015    Procedure: ARTHROPLASTY BIPOLAR HIP (HEMIARTHROPLASTY);  Surgeon: Sheral Apley, MD;  Location: [facility] OR;  Service: Orthopedics;  Laterality: Right;   Social History:   reports that she has never smoked. She does not have any smokeless tobacco history on file. She reports that she does not drink alcohol. Her drug history is not on file.  Family History  Problem Relation Age of Onset  . Diabetes Mother   . Heart attack Father     Medications:   Medication List       This list is accurate as of: 03/05/15  5:00 PM.  Always use your most recent med list.               acetaminophen 325 MG tablet  Commonly known as:  TYLENOL  Take 650 mg by mouth every 4 (four) hours as needed for mild pain, fever or headache.     atenolol 25 MG tablet  Commonly known as:  TENORMIN  Take 0.5 tablets (12.5 mg total) by mouth 2 (two) times daily.     cephALEXin 500 MG capsule  Commonly known as:  KEFLEX  Take 1 capsule (500 mg total) by mouth 2 (two) times daily.     cholecalciferol 1000 UNITS tablet  Commonly known as:  VITAMIN  D  Take 3,000 Units by mouth daily.     diphenhydrAMINE 25 mg capsule  Commonly known as:  BENADRYL  Take 25 mg by mouth every 4 (four) hours as needed for itching.     enoxaparin 40 MG/0.4ML injection  Commonly known as:  LOVENOX  Inject 0.4 mLs (40 mg total) into the skin daily.     escitalopram 20 MG tablet  Commonly known as:  LEXAPRO  Take 20 mg by mouth daily.     gabapentin 100 MG capsule  Commonly known as:  NEURONTIN  Take 100 mg by mouth 2 (two) times daily.     guaifenesin 100 MG/5ML syrup  Commonly known as:  ROBITUSSIN  Take 200 mg by mouth every 4 (four) hours as needed for cough.     HYDROcodone-acetaminophen 5-325 MG per tablet  Commonly known as:  NORCO  Take 1-2 tablets by mouth every 6 (six) hours as needed for moderate pain.     lamoTRIgine 100 MG tablet  Commonly known as:  LAMICTAL  Take 100 mg by mouth daily.     multivitamin with minerals Tabs tablet  Take 1 tablet by mouth daily.     potassium chloride SA 20 MEQ tablet  Commonly known as:  K-DUR,KLOR-CON  Take 20 mEq by mouth daily.     simvastatin 20 MG tablet  Commonly known as:  ZOCOR  Take 20 mg by mouth daily.         Physical Exam: Filed Vitals:   03/05/15 1659  BP: 125/76  Pulse: 88  Temp: 98.4 F (36.9 C)  Resp: 16  Weight: 167 lb (75.751 kg)  SpO2: 94%    General- elderly female, in no acute distress Head- normocephalic, atraumatic Throat- moist mucus membrane, poor dentition Eyes- PERRLA, EOMI, no pallor, no icterus, no discharge, normal conjunctiva, normal sclera Neck- no cervical lymphadenopathy, no thyromegaly, no jugular vein distension Cardiovascular- normal s1,s2, no murmurs, palpable dorsalis pedis Respiratory- bilateral clear to auscultation, no wheeze, no rhonchi, no crackles, no use of accessory muscles Abdomen- bowel sounds present, soft, non tender Musculoskeletal- able to move all 4 extremities, limited right leg range of motion  Psychiatry- normal mood  and affect    Labs reviewed: Basic Metabolic Panel:  Recent Labs  16/10/96 1735  02/26/15 0410 02/27/15 0410 02/28/15 0510  NA 138  < > 139 135 136  K 4.1  < > 4.2 4.0 3.8  CL 100*  < > 105 101 101  CO2 27  < > 28 27 29   GLUCOSE 161*  < > 93 121* 105*  BUN 22*  < > 26* 21* 13  CREATININE 1.39*  < > 1.50* 1.38* 1.13*  CALCIUM 9.7  < > 8.7* 8.3* 8.6*  MG 1.9  --   --   --   --   PHOS 3.4  --   --   --   --   < > = values in this interval not displayed. Liver Function Tests:  Recent Labs  02/25/15 0340 02/26/15 0410 02/27/15 0410  AST 31 21 26   ALT 17 14 12*  ALKPHOS 53 41 40  BILITOT 0.7 0.6 0.4  PROT 7.5 6.4* 5.9*  ALBUMIN 3.5 3.0* 2.5*   CBC:  Recent Labs  02/24/15 1735  02/27/15 0410 02/28/15 0510 03/01/15 0404  WBC 14.8*  < > 10.6* 12.0* 10.6*  NEUTROABS 13.2*  --   --   --   --   HGB 11.8*  < > 9.6* 9.4* 8.0*  HCT 35.2*  < > 29.3* 27.7* 24.2*  MCV 94.6  < > 96.1 94.2 94.9  PLT 152  < > 133* 142* 170  < > = values in this interval not displayed.  Radiological Exams: Dg Knee 1-2 Views Right  02/24/2015   CLINICAL DATA:  RIGHT hip and groin pain, slid out of a chair today, has a hip fracture  EXAM: RIGHT KNEE - 1-2 VIEW  COMPARISON:  None  FINDINGS: Osseous demineralization.  Mild medial compartment joint space narrowing.  No acute fracture, dislocation or bone destruction.  No knee joint effusion.  IMPRESSION: Osseous demineralization without acute bony abnormalities.   Electronically Signed   By: Ulyses Southward M.D.   On: 02/24/2015 17:27   Chest Portable 1 View  02/24/2015   CLINICAL DATA:  80 year old female with hip fracture. Preop chest x-ray  EXAM: PORTABLE CHEST - 1 VIEW  COMPARISON:  None.  FINDINGS: The heart size and mediastinal contours are within normal limits. Both lungs are clear. The visualized skeletal structures are unremarkable.  IMPRESSION: No active disease.   Electronically Signed   By: Elgie Collard M.D.   On: 02/24/2015 23:44   Dg Hip  Unilat With Pelvis  2-3 Views Right  02/24/2015   CLINICAL DATA:  Patient's lytic from a chair today with an is experiencing persistent right hip and groin pain  EXAM: DG HIP (WITH OR WITHOUT PELVIS) 2-3V RIGHT  COMPARISON:  None in PACs  FINDINGS: The patient has sustained an acute subcapital fracture of the right hip. There is angulation at the fracture site. The femoral head remains positioned in the acetabular cup. The intertrochanteric and subtrochanteric regions appear normal. The observed portions of the right hemipelvis are unremarkable.  IMPRESSION: The patient has sustained an acute subcapital fracture of the right hip.   Electronically Signed   By: David  [place] M.D.   On: 02/24/2015 17:27    Assessment/Plan  Physical deconditioning Will have her work with physical therapy and occupational therapy team to help with gait training and muscle strengthening exercises.fall precautions. Skin care. Encourage to be out of bed.   Right hip closed fracture S/p right hip hemiarthroplasty. Will have her work with physical therapy and occupational therapy team to help with gait training and muscle strengthening exercises.fall precautions. Skin care. Encourage to be out of bed. Continue lovenox for dvt prophylaxis. Continue vitamin d supplement. Continue norco 5-325 mg 1-2 tab q6h prn pain. WBAT  Blood loss anemia Post op, monitor h&h  Dysphagia Seen by SLP, start with mechanical soft diet and thin liquid diet from today, aspiration precautions  uti Reviewed urine culture report showing 20,000 colonies of strept agelecticae. D/c kelfex with culture growing < 100,000 colonies of bacteria. Monitor clinically  Leukocytosis Likely reactive leukocytosis, monitor cbc  HTN Stable reading, monitor vitals, continue atenolol 12.5 mg bid  HLD Continue simvastatin 20 mg daily  Intellectual disability Continue home regimen lamictal 100 mg daily with lexapro 20 mg daily  Goals of care: short term  rehabilitation   Labs/tests ordered: cbc with diff, cmp  Family/ staff Communication: reviewed care plan with patient and nursing supervisor    Oneal Grout, MD  [facility] [phone] (Monday-Friday 8 am - 5 pm) [phone] (afterhours)

## 2015-03-07 NOTE — Progress Notes (Signed)
Patient ID: Mandy Anderson, female   DOB: [birth year], 80 y.o.   MRN: [id number]    DATE:  03/03/15 MRN:  [id number]  BIRTHDAY: [birth year]  Facility:  Nursing Home Location:  [facility]  Nursing Home Room Number: 1006-2  LEVEL OF CARE:  SNF [phone])  Contact Information    Name Relation Home Work Mobile   Arnez,Terek  [phone]     Shella Spearing [phone]  [phone]      Chief Complaint  Patient presents with  . Hospitalization Follow-up    Right hip fracture S/P right hemiarthroplasty, hypertension, hyperlipidemia, UTI, anemia, depression and intellectual disability    HISTORY OF PRESENT ILLNESS:  This is a 80 year old female who was been admitted to [facility] on 03/02/15 from [facility]. She has PMH of mental retardation and hypertension. She leaves in SNF [facility]. She slid to the floor from her wheelchair causing a subcapital fracture of the right hip. Orthopedic was consulted and she had right hemiarthroplasty on 02/26/15.  She has been admitted for a short-term rehabilitation.   PAST MEDICAL HISTORY:  Past Medical History  Diagnosis Date  . Retardation     Per [facility] records is functional   . Hypertension      CURRENT MEDICATIONS: Reviewed  Patient's Medications  New Prescriptions   No medications on file  Previous Medications   ACETAMINOPHEN (TYLENOL) 325 MG TABLET    Take 650 mg by mouth every 4 (four) hours as needed for mild pain, fever or headache.   ATENOLOL (TENORMIN) 25 MG TABLET    Take 0.5 tablets (12.5 mg total) by mouth 2 (two) times daily.   CEPHALEXIN (KEFLEX) 500 MG CAPSULE    Take 1 capsule (500 mg total) by mouth 2 (two) times daily.   CHOLECALCIFEROL (VITAMIN D) 1000 UNITS TABLET    Take 3,000 Units by mouth daily.   DIPHENHYDRAMINE (BENADRYL) 25 MG CAPSULE    Take 25 mg by mouth every 4 (four) hours as needed for itching.   ENOXAPARIN (LOVENOX) 40 MG/0.4ML INJECTION    Inject 0.4 mLs (40 mg total)  into the skin daily.   ESCITALOPRAM (LEXAPRO) 20 MG TABLET    Take 20 mg by mouth daily.   GABAPENTIN (NEURONTIN) 100 MG CAPSULE    Take 100 mg by mouth 2 (two) times daily.   GUAIFENESIN (ROBITUSSIN) 100 MG/5ML SYRUP    Take 200 mg by mouth every 4 (four) hours as needed for cough.   HYDROCODONE-ACETAMINOPHEN (NORCO) 5-325 MG PER TABLET    Take 1-2 tablets by mouth every 6 (six) hours as needed for moderate pain.   LAMOTRIGINE (LAMICTAL) 100 MG TABLET    Take 100 mg by mouth daily.   MULTIPLE VITAMIN (MULTIVITAMIN WITH MINERALS) TABS TABLET    Take 1 tablet by mouth daily.   POTASSIUM CHLORIDE SA (K-DUR,KLOR-CON) 20 MEQ TABLET    Take 20 mEq by mouth daily.   SIMVASTATIN (ZOCOR) 20 MG TABLET    Take 20 mg by mouth daily.  Modified Medications   No medications on file  Discontinued Medications   No medications on file     No Known Allergies   REVIEW OF SYSTEMS:  Unable to obtain due to patient not a good historian   PHYSICAL EXAMINATION   GENERAL APPEARANCE: Well nourished. In no acute distress. Normal body habitus SKIN:  Right hip surgical incision has Steri-Strips and dry dressing; no erythema HEAD: Normal in size and contour. No evidence of trauma  EYES: Lids open and close normally. No blepharitis, entropion or ectropion. PERRL. Conjunctivae are clear and sclerae are white. Lenses are without opacity EARS: Pinnae are normal. Patient hears normal voice tunes of the examiner MOUTH and THROAT: Lips are without lesions. Oral mucosa is moist and without lesions. Tongue is normal in shape, size, and color and without lesions NECK: supple, trachea midline, no neck masses, no thyroid tenderness, no thyromegaly LYMPHATICS: no LAN in the neck, no supraclavicular LAN RESPIRATORY: breathing is even & unlabored, BS CTAB CARDIAC: RRR, no murmur,no extra heart sounds, no edema GI: abdomen soft, normal BS, no masses, no tenderness, no hepatomegaly, no splenomegaly EXTREMITIES:  Able to move 4  extremities PSYCHIATRIC: Alert and oriented X 3. Affect and behavior are appropriate  LABS/RADIOLOGY: Labs reviewed: Basic Metabolic Panel:  Recent Labs  16/10/96 1735  02/26/15 0410 02/27/15 0410 02/28/15 0510  NA 138  < > 139 135 136  K 4.1  < > 4.2 4.0 3.8  CL 100*  < > 105 101 101  CO2 27  < > GLUCOSE 161*  < > 93 121* 105*  BUN 22*  < > 26* 21* 13  CREATININE 1.39*  < > 1.50* 1.38* 1.13*  CALCIUM 9.7  < > 8.7* 8.3* 8.6*  MG 1.9  --   --   --   --   PHOS 3.4  --   --   --   --   < > = values in this interval not displayed. Liver Function Tests:  Recent Labs  02/25/15 0340 02/26/15 0410 02/27/15 0410  AST ALT 17 14 12*  ALKPHOS 53 41 40  BILITOT 0.7 0.6 0.4  PROT 7.5 6.4* 5.9*  ALBUMIN 3.5 3.0* 2.5*   CBC:  Recent Labs  02/24/15 1735  02/27/15 0410 02/28/15 0510 03/01/15 0404  WBC 14.8*  < > 10.6* 12.0* 10.6*  NEUTROABS 13.2*  --   --   --   --   HGB 11.8*  < > 9.6* 9.4* 8.0*  HCT 35.2*  < > 29.3* 27.7* 24.2*  MCV 94.6  < > 96.1 94.2 94.9  PLT 152  < > 133* 142* 170  < > = values in this interval not displayed.    Dg Knee 1-2 Views Right  02/24/2015   CLINICAL DATA:  RIGHT hip and groin pain, slid out of a chair today, has a hip fracture  EXAM: RIGHT KNEE - 1-2 VIEW  COMPARISON:  None  FINDINGS: Osseous demineralization.  Mild medial compartment joint space narrowing.  No acute fracture, dislocation or bone destruction.  No knee joint effusion.  IMPRESSION: Osseous demineralization without acute bony abnormalities.   Electronically Signed   By: Ulyses Southward M.D.   On: 02/24/2015 17:27   Pelvis Portable  02/26/2015   CLINICAL DATA:  POSTOP RIGHT HIP REPLACEMENT.  EXAM: PORTABLE PELVIS 1-2 VIEWS  COMPARISON:  02/24/2015  FINDINGS: Single frontal view demonstrates right hip hemiarthroplasty. Femoral head component appears well seated with within the acetabulum on this frontal view. No acute fractures identified.  IMPRESSION: Status post  right hip arthroplasty.  No adverse features identified.   Electronically Signed   By: Norva Pavlov M.D.   On: 02/26/2015 10:20   Chest Portable 1 View  02/24/2015   CLINICAL DATA:  80 year old female with hip fracture. Preop chest x-ray  EXAM: PORTABLE CHEST - 1 VIEW  COMPARISON:  None.  FINDINGS: The heart size and mediastinal  contours are within normal limits. Both lungs are clear. The visualized skeletal structures are unremarkable.  IMPRESSION: No active disease.   Electronically Signed   By: Elgie Collard M.D.   On: 02/24/2015 23:44   Dg Hip Unilat With Pelvis 2-3 Views Right  02/24/2015   CLINICAL DATA:  Patient's lytic from a chair today with an is experiencing persistent right hip and groin pain  EXAM: DG HIP (WITH OR WITHOUT PELVIS) 2-3V RIGHT  COMPARISON:  None in PACs  FINDINGS: The patient has sustained an acute subcapital fracture of the right hip. There is angulation at the fracture site. The femoral head remains positioned in the acetabular cup. The intertrochanteric and subtrochanteric regions appear normal. The observed portions of the right hemipelvis are unremarkable.  IMPRESSION: The patient has sustained an acute subcapital fracture of the right hip.   Electronically Signed   By: David  [place] M.D.   On: 02/24/2015 17:27    ASSESSMENT/PLAN:  Right hip fracture S/P right hemiarthroplasty - for rehabilitation; continue Lovenox 40 mg subcutaneous daily by mouth 03/11/15 then stops for DVT prophylaxis; Norco 5/325 mg 1-2 tabs by mouth every 6 hours when necessary for pain; RLE WBAT; follow-up with Dr. Eulah Pont, ortho surgeon, in 2 weeks  Hypertension - well controlled; continue atenolol 25 mg 1/2 tab = 12.5 mg by mouth twice a day  UTI - since white count is slightly elevated will continue Keflex 500 mg 1 capsule by mouth twice a day until the urine culture is done  Hyperlipidemia - continue simvastatin 20 mg 1 tab by mouth daily   Anemia, acute blood loss  - hemoglobin 8.0;  will monitor   Depression  - old is stable; continue Lexapro 20 mg 1 tab by mouth daily   Intellect disability  - continue Lamictal and Neurontin  ;   Goals of care:  Short-term rehabilitation    [facility], NP [facility] [phone]

## 2015-03-11 ENCOUNTER — Non-Acute Institutional Stay (SKILLED_NURSING_FACILITY): Payer: Medicare Other | Admitting: Adult Health

## 2015-03-11 ENCOUNTER — Encounter: Payer: Self-pay | Admitting: Adult Health

## 2015-03-11 DIAGNOSIS — I10 Essential (primary) hypertension: Secondary | ICD-10-CM

## 2015-03-11 DIAGNOSIS — F32A Depression, unspecified: Secondary | ICD-10-CM

## 2015-03-11 DIAGNOSIS — E785 Hyperlipidemia, unspecified: Secondary | ICD-10-CM | POA: Diagnosis not present

## 2015-03-11 DIAGNOSIS — D62 Acute posthemorrhagic anemia: Secondary | ICD-10-CM

## 2015-03-11 DIAGNOSIS — F329 Major depressive disorder, single episode, unspecified: Secondary | ICD-10-CM | POA: Diagnosis not present

## 2015-03-11 DIAGNOSIS — F79 Unspecified intellectual disabilities: Secondary | ICD-10-CM

## 2015-03-11 DIAGNOSIS — S72001D Fracture of unspecified part of neck of right femur, subsequent encounter for closed fracture with routine healing: Secondary | ICD-10-CM | POA: Diagnosis not present

## 2015-03-11 NOTE — Progress Notes (Signed)
Patient ID: Mandy Anderson, female   DOB: October 13, 1934, 80 y.o.   MRN: [id number]    DATE:  03/11/15 MRN:  [id number]  BIRTHDAY: [birth year]  Facility:  Nursing Home Location:  [facility]  Nursing Home Room Number: 1006-2  LEVEL OF CARE:  [phone])  Contact Information    Name Relation Home Work Mobile   Arnez,Terek  [phone]     Shella Spearing [phone]  [phone]      Chief Complaint  Patient presents with  . Discharge Note    Right hip fracture S/P right hemiarthroplasty, hypertension, hyperlipidemia, anemia, depression and intellectual disability    HISTORY OF PRESENT ILLNESS:  This is a 80 year old female who is for discharge to ALF. DME:  Rolling walker. She has been admitted to [facility] on 03/02/15 from [facility]. She has PMH of mental retardation and hypertension. She leaves in SNF [facility]. She slid to the floor from her wheelchair causing a subcapital fracture of the right hip. Orthopedic was consulted and she had right hemiarthroplasty on 02/26/15.  Patient was admitted to this facility for short-term rehabilitation after the patient's recent hospitalization.  Patient has completed SNF rehabilitation and therapy has cleared the patient for discharge.   PAST MEDICAL HISTORY:  Past Medical History  Diagnosis Date  . Retardation     Per [facility] records is functional   . Hypertension      CURRENT MEDICATIONS: Reviewed     Medication List       This list is accurate as of: 03/11/15  1:24 PM.  Always use your most recent med list.               acetaminophen 325 MG tablet  Commonly known as:  TYLENOL  Take 650 mg by mouth every 4 (four) hours as needed for mild pain, fever or headache.     cholecalciferol 1000 UNITS tablet  Commonly known as:  VITAMIN D  Take 3,000 Units by mouth daily.     diphenhydrAMINE 25 mg capsule  Commonly known as:  BENADRYL  Take 25 mg by mouth every 4 (four) hours as needed for  itching.     enoxaparin 40 MG/0.4ML injection  Commonly known as:  LOVENOX  Inject 0.4 mLs (40 mg total) into the skin daily.     escitalopram 20 MG tablet  Commonly known as:  LEXAPRO  Take 20 mg by mouth daily.     gabapentin 100 MG capsule  Commonly known as:  NEURONTIN  Take 100 mg by mouth 2 (two) times daily.     guaifenesin 100 MG/5ML syrup  Commonly known as:  ROBITUSSIN  Take 200 mg by mouth every 4 (four) hours as needed for cough.     HYDROcodone-acetaminophen 5-325 MG per tablet  Commonly known as:  NORCO  Take 1-2 tablets by mouth every 6 (six) hours as needed for moderate pain.     lamoTRIgine 100 MG tablet  Commonly known as:  LAMICTAL  Take 100 mg by mouth daily.     multivitamin with minerals Tabs tablet  Take 1 tablet by mouth daily.     potassium chloride SA 20 MEQ tablet  Commonly known as:  K-DUR,KLOR-CON  Take 20 mEq by mouth daily.     simvastatin 20 MG tablet  Commonly known as:  ZOCOR  Take 20 mg by mouth daily.         No Known Allergies   REVIEW OF SYSTEMS:  Unable  to obtain due to patient not a good historian   PHYSICAL EXAMINATION   GENERAL APPEARANCE: Well nourished. In no acute distress. Normal body habitus SKIN:  Right hip surgical incision is healed; no erythema  HEAD: Normal in size and contour. No evidence of trauma EYES: Lids open and close normally. No blepharitis, entropion or ectropion. PERRL. Conjunctivae are clear and sclerae are white. Lenses are without opacity EARS: Pinnae are normal. Patient hears normal voice tunes of the examiner MOUTH and THROAT: Lips are without lesions. Oral mucosa is moist and without lesions. Tongue is normal in shape, size, and color and without lesions NECK: supple, trachea midline, no neck masses, no thyroid tenderness, no thyromegaly LYMPHATICS: no LAN in the neck, no supraclavicular LAN RESPIRATORY: breathing is even & unlabored, BS CTAB CARDIAC: RRR, no murmur,no extra heart sounds,  no edema GI: abdomen soft, normal BS, no masses, no tenderness, no hepatomegaly, no splenomegaly EXTREMITIES:  Able to move 4 extremities PSYCHIATRIC: Alert and oriented X 3. Affect and behavior are appropriate  LABS/RADIOLOGY: Labs reviewed: Basic Metabolic Panel:  Recent Labs  91/47/82 1735  02/26/15 0410 02/27/15 0410 02/28/15 0510  NA 138  < > 139 135 136  K 4.1  < > 4.2 4.0 3.8  CL 100*  < > 105 101 101  CO2 27  < > GLUCOSE 161*  < > 93 121* 105*  BUN 22*  < > 26* 21* 13  CREATININE 1.39*  < > 1.50* 1.38* 1.13*  CALCIUM 9.7  < > 8.7* 8.3* 8.6*  MG 1.9  --   --   --   --   PHOS 3.4  --   --   --   --   < > = values in this interval not displayed. Liver Function Tests:  Recent Labs  02/25/15 0340 02/26/15 0410 02/27/15 0410  AST ALT 17 14 12*  ALKPHOS 53 41 40  BILITOT 0.7 0.6 0.4  PROT 7.5 6.4* 5.9*  ALBUMIN 3.5 3.0* 2.5*   CBC:  Recent Labs  02/24/15 1735  02/27/15 0410 02/28/15 0510 03/01/15 0404  WBC 14.8*  < > 10.6* 12.0* 10.6*  NEUTROABS 13.2*  --   --   --   --   HGB 11.8*  < > 9.6* 9.4* 8.0*  HCT 35.2*  < > 29.3* 27.7* 24.2*  MCV 94.6  < > 96.1 94.2 94.9  PLT 152  < > 133* 142* 170  < > = values in this interval not displayed.    Dg Knee 1-2 Views Right  02/24/2015   CLINICAL DATA:  RIGHT hip and groin pain, slid out of a chair today, has a hip fracture  EXAM: RIGHT KNEE - 1-2 VIEW  COMPARISON:  None  FINDINGS: Osseous demineralization.  Mild medial compartment joint space narrowing.  No acute fracture, dislocation or bone destruction.  No knee joint effusion.  IMPRESSION: Osseous demineralization without acute bony abnormalities.   Electronically Signed   By: Ulyses Southward M.D.   On: 02/24/2015 17:27   Pelvis Portable  02/26/2015   CLINICAL DATA:  POSTOP RIGHT HIP REPLACEMENT.  EXAM: PORTABLE PELVIS 1-2 VIEWS  COMPARISON:  02/24/2015  FINDINGS: Single frontal view demonstrates right hip hemiarthroplasty. Femoral head component  appears well seated with within the acetabulum on this frontal view. No acute fractures identified.  IMPRESSION: Status post right hip arthroplasty.  No adverse features identified.   Electronically Signed   By: Lanora Manis  Manson Passey M.D.   On: 02/26/2015 10:20   Chest Portable 1 View  02/24/2015   CLINICAL DATA:  80 year old female with hip fracture. Preop chest x-ray  EXAM: PORTABLE CHEST - 1 VIEW  COMPARISON:  None.  FINDINGS: The heart size and mediastinal contours are within normal limits. Both lungs are clear. The visualized skeletal structures are unremarkable.  IMPRESSION: No active disease.   Electronically Signed   By: Elgie Collard M.D.   On: 02/24/2015 23:44   Dg Hip Unilat With Pelvis 2-3 Views Right  02/24/2015   CLINICAL DATA:  Patient's lytic from a chair today with an is experiencing persistent right hip and groin pain  EXAM: DG HIP (WITH OR WITHOUT PELVIS) 2-3V RIGHT  COMPARISON:  None in PACs  FINDINGS: The patient has sustained an acute subcapital fracture of the right hip. There is angulation at the fracture site. The femoral head remains positioned in the acetabular cup. The intertrochanteric and subtrochanteric regions appear normal. The observed portions of the right hemipelvis are unremarkable.  IMPRESSION: The patient has sustained an acute subcapital fracture of the right hip.   Electronically Signed   By: David  [place] M.D.   On: 02/24/2015 17:27    ASSESSMENT/PLAN:  Right hip fracture S/P right hemiarthroplasty - for rehabilitation; continue Lovenox 40 mg subcutaneous daily till 03/11/15 then stop  for DVT prophylaxis; Norco 5/325 mg 1-2 tabs by mouth every 6 hours when necessary for pain; RLE WBAT; follow-up with Dr. Eulah Pont, ortho surgeon, in1 week  Hypertension - well controlled; continue atenolol 25 mg 1/2 tab = 12.5 mg by mouth twice a day  Hyperlipidemia - continue simvastatin 20 mg 1 tab by mouth daily   Anemia, acute blood loss  - hemoglobin 8.0; refused labs to be  done  Depression  - old is stable; continue Lexapro 20 mg 1 tab by mouth daily   Intellect disability  - continue Lamictal and Neurontin  ;    I have filled out patient's discharge paperwork and written prescriptions.    DME provided:  Rolling walker  Total discharge time: Greater than 30 minutes  Discharge time involved coordination of the discharge process with Child psychotherapist, nursing staff and therapy department. Medical justification for DME verified.     [facility], NP [employer] [phone]

## 2016-07-30 IMAGING — CR DG CHEST 1V PORT
1 series · 1 of 1 positions shown · non-contrast
Comparison: None.

CLINICAL DATA: 80-year-old female with hip fracture. Preop chest
x-ray

EXAM:
PORTABLE CHEST - 1 VIEW

[AP]
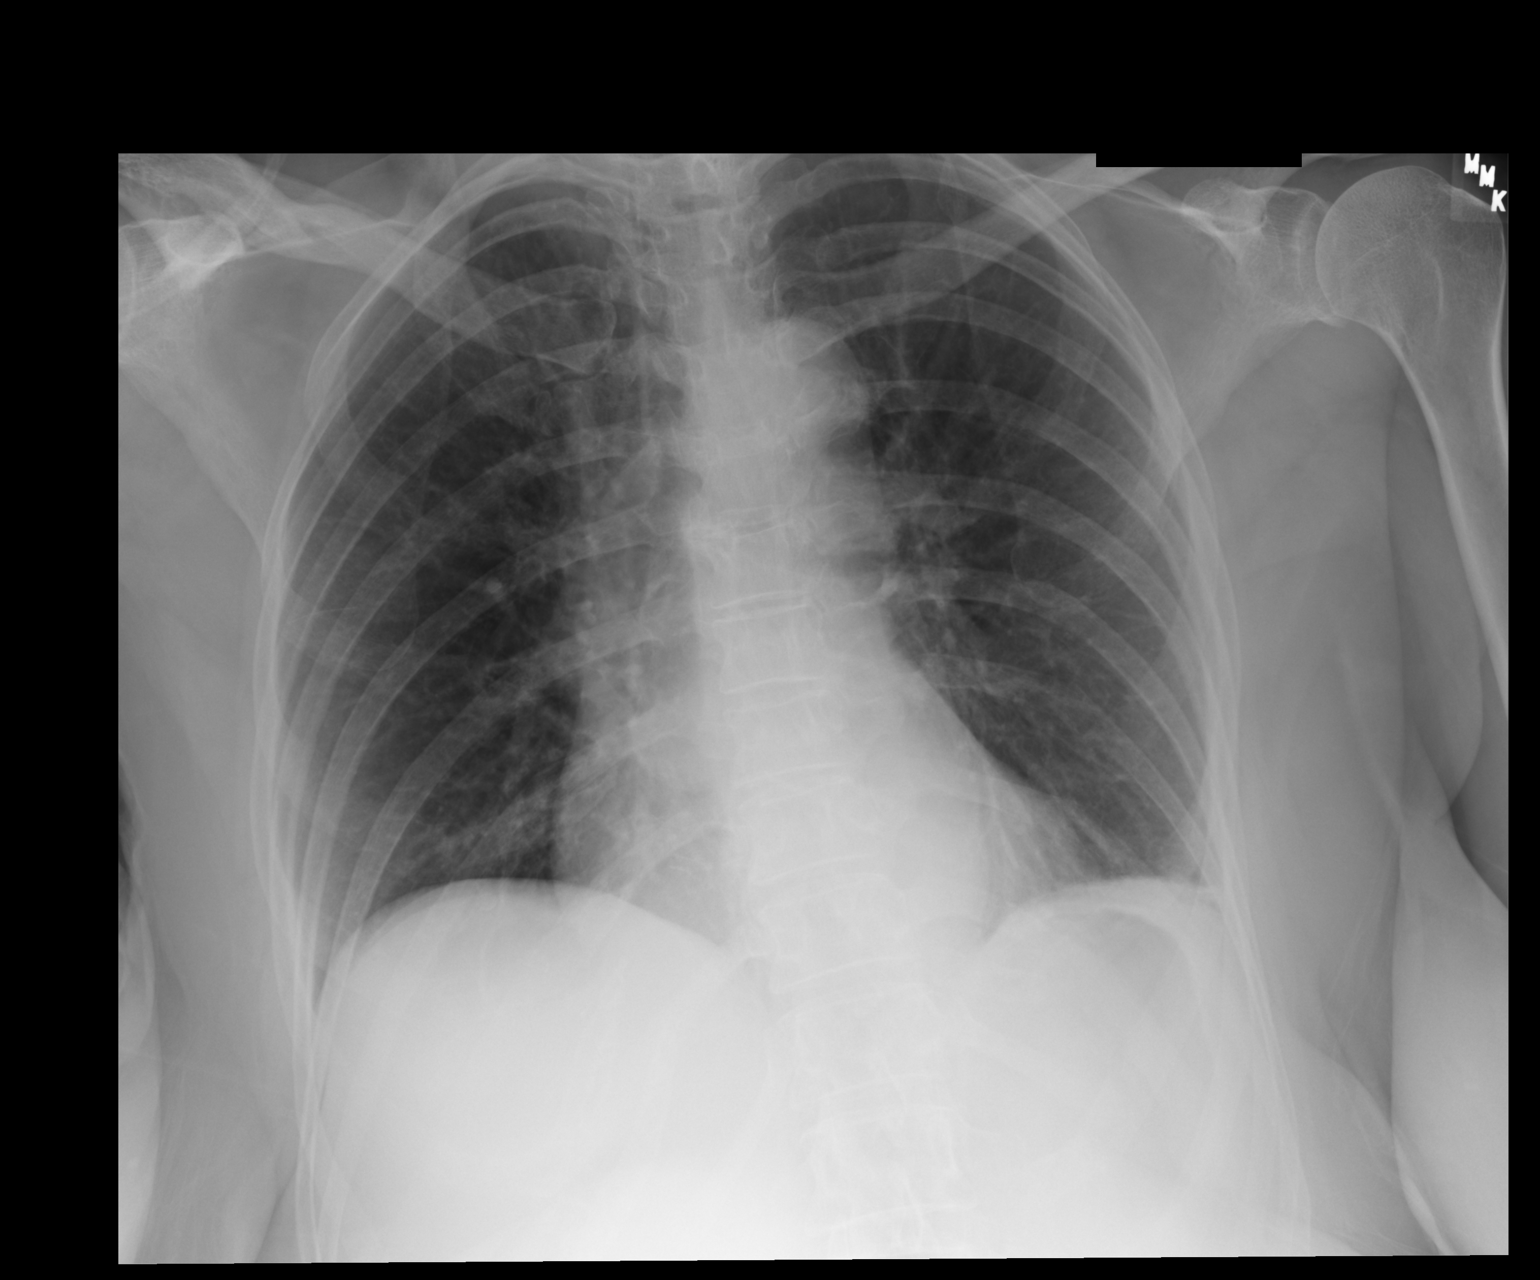

[1 of 1 positions shown; findings below may reference images not displayed]

FINDINGS: The heart size and mediastinal contours are within normal limits.
Both lungs are clear. The visualized skeletal structures are
unremarkable.
IMPRESSION: No active disease.

## 2016-07-30 IMAGING — CR DG KNEE 1-2V*R*
2 series · 2 of 2 positions shown · non-contrast
Comparison: None

CLINICAL DATA: RIGHT hip and groin pain, slid out of a chair today,
has a hip fracture

EXAM:
RIGHT KNEE - 1-2 VIEW

[x knee ap right]
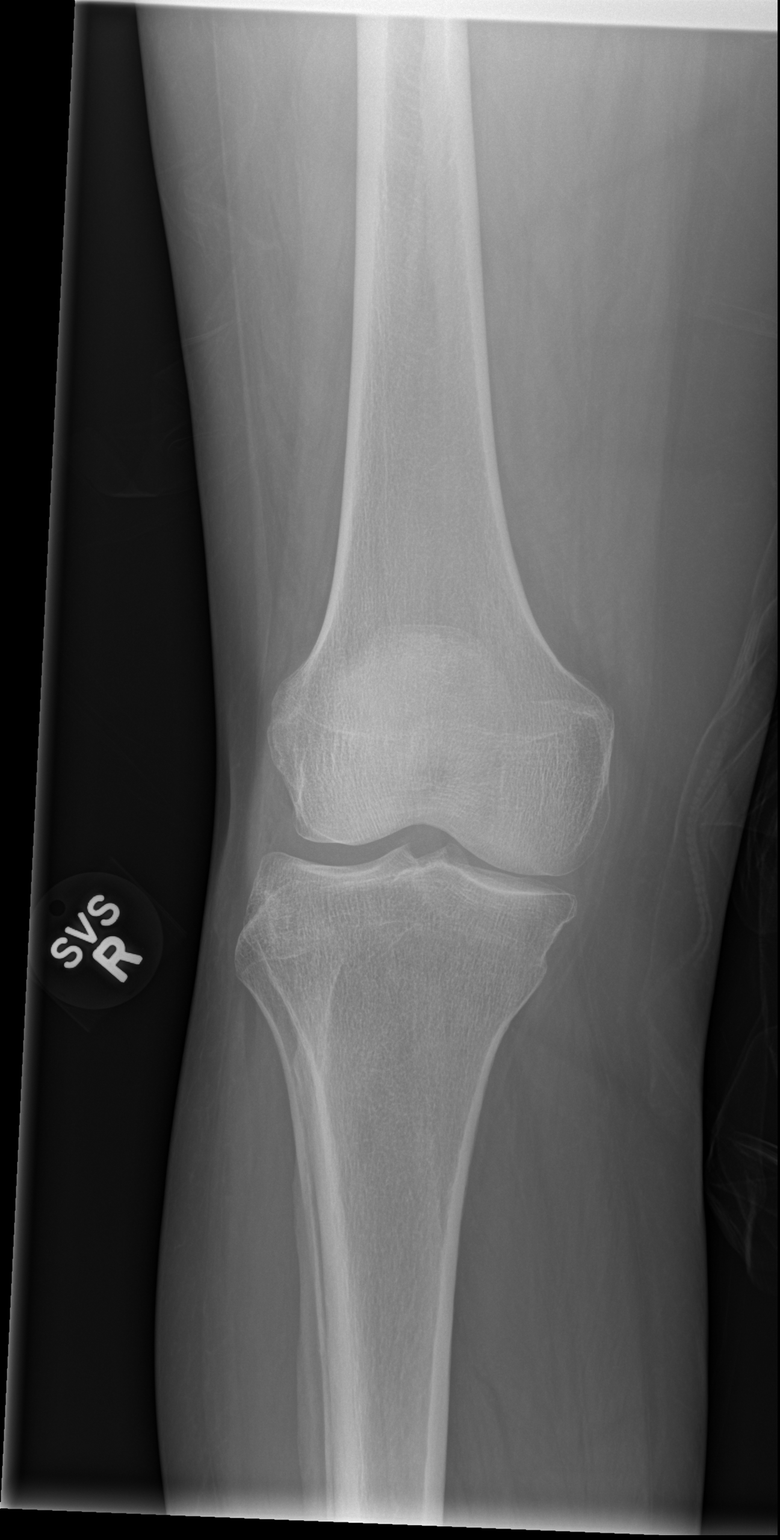

[x knee lat right]
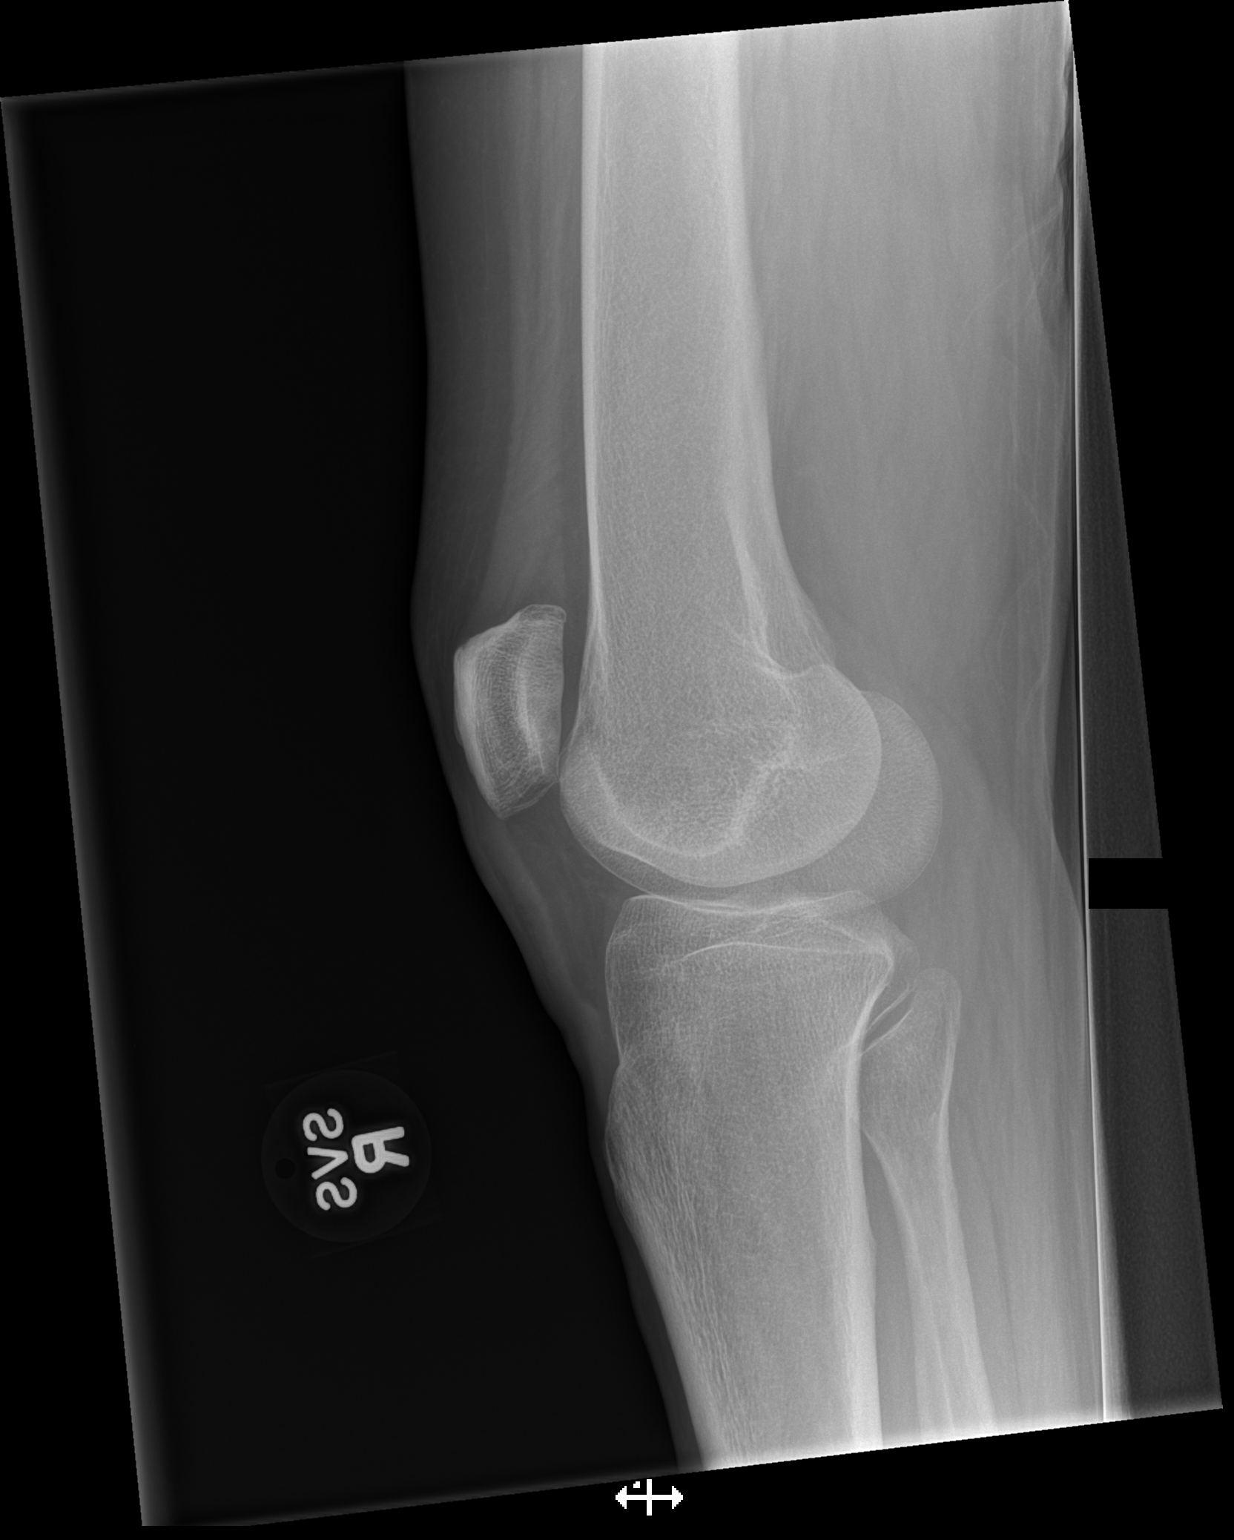

[2 of 2 positions shown; findings below may reference images not displayed]

FINDINGS: Osseous demineralization.

Mild medial compartment joint space narrowing.

No acute fracture, dislocation or bone destruction.

No knee joint effusion.
IMPRESSION: Osseous demineralization without acute bony abnormalities.

## 2016-08-01 IMAGING — CR DG PORTABLE PELVIS
1 series · 1 of 1 positions shown · non-contrast
Comparison: 02/24/2015

CLINICAL DATA: POSTOP RIGHT HIP REPLACEMENT.

EXAM:
PORTABLE PELVIS 1-2 VIEWS

[AP]
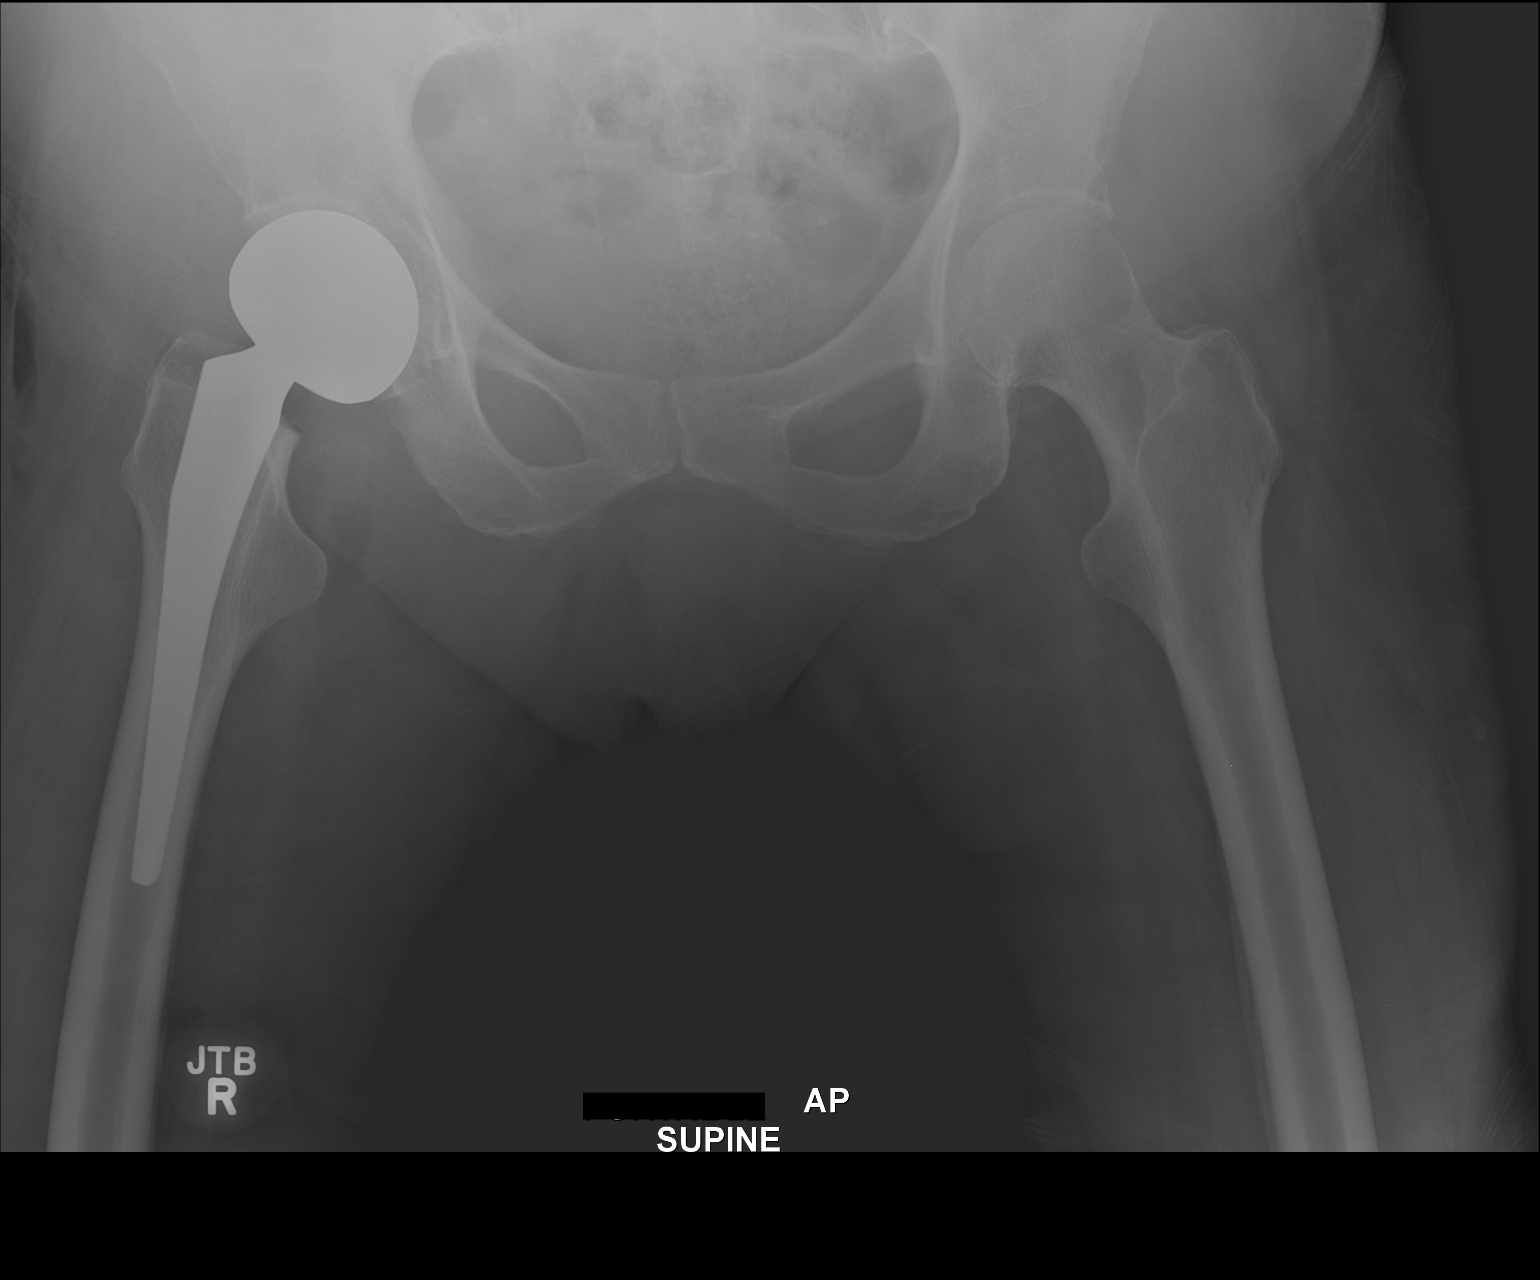

[1 of 1 positions shown; findings below may reference images not displayed]

FINDINGS: Single frontal view demonstrates right hip hemiarthroplasty. Femoral
head component appears well seated with within the acetabulum on
this frontal view. No acute fractures identified.
IMPRESSION: Status post right hip arthroplasty.  No adverse features identified.
